# Patient Record
Sex: Female | Born: 1990 | Race: Black or African American | Hispanic: No | Marital: Married | Smoking: Never smoker
Health system: Southern US, Community
[De-identification: ages and names within clinical notes are randomized; demographics above are authoritative.]

## PROBLEM LIST (undated history)

## (undated) DIAGNOSIS — N809 Endometriosis, unspecified: Secondary | ICD-10-CM

## (undated) DIAGNOSIS — R51 Headache: Secondary | ICD-10-CM

## (undated) DIAGNOSIS — Z803 Family history of malignant neoplasm of breast: Secondary | ICD-10-CM

## (undated) DIAGNOSIS — R519 Headache, unspecified: Secondary | ICD-10-CM

## (undated) DIAGNOSIS — G510 Bell's palsy: Secondary | ICD-10-CM

## (undated) DIAGNOSIS — T753XXA Motion sickness, initial encounter: Secondary | ICD-10-CM

## (undated) DIAGNOSIS — G43909 Migraine, unspecified, not intractable, without status migrainosus: Secondary | ICD-10-CM

## (undated) DIAGNOSIS — Z3483 Encounter for supervision of other normal pregnancy, third trimester: Secondary | ICD-10-CM

## (undated) DIAGNOSIS — O26893 Other specified pregnancy related conditions, third trimester: Secondary | ICD-10-CM

## (undated) DIAGNOSIS — O169 Unspecified maternal hypertension, unspecified trimester: Secondary | ICD-10-CM

## (undated) HISTORY — DX: Migraine, unspecified, not intractable, without status migrainosus: G43.909

## (undated) HISTORY — DX: Family history of malignant neoplasm of breast: Z80.3

## (undated) HISTORY — PX: WISDOM TOOTH EXTRACTION: SHX21

---
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
## 2012-11-18 ENCOUNTER — Ambulatory Visit: Payer: Self-pay | Admitting: Family Medicine

## 2012-12-02 ENCOUNTER — Encounter: Payer: Self-pay | Admitting: Family Medicine

## 2012-12-13 ENCOUNTER — Encounter: Payer: Self-pay | Admitting: Family Medicine

## 2013-01-12 ENCOUNTER — Encounter: Payer: Self-pay | Admitting: Family Medicine

## 2013-02-03 ENCOUNTER — Emergency Department: Payer: Self-pay | Admitting: Emergency Medicine

## 2013-06-16 ENCOUNTER — Ambulatory Visit (INDEPENDENT_AMBULATORY_CARE_PROVIDER_SITE_OTHER): Payer: Federal, State, Local not specified - PPO | Admitting: General Surgery

## 2013-06-16 ENCOUNTER — Encounter: Payer: Self-pay | Admitting: General Surgery

## 2013-06-16 VITALS — BP 130/80 | Ht 65.0 in | Wt 187.0 lb

## 2013-06-16 DIAGNOSIS — R109 Unspecified abdominal pain: Secondary | ICD-10-CM

## 2013-06-16 DIAGNOSIS — R110 Nausea: Secondary | ICD-10-CM

## 2013-06-16 DIAGNOSIS — Z8669 Personal history of other diseases of the nervous system and sense organs: Secondary | ICD-10-CM

## 2013-06-16 DIAGNOSIS — Z3200 Encounter for pregnancy test, result unknown: Secondary | ICD-10-CM
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
## 2013-06-16 NOTE — Patient Instructions (Addendum)
The patient is aware to call back for any questions or concerns.  This patient is to have labs drawn at [facility] lab today.  Patient has been scheduled for an abdominal ultrasound at [facility] for 06-29-13 at 8 am (arrive 7:45 am). If abdominal ultrasound is negative, patient will have a HIDA scan at 8:30 am the same day. Patient will need to have a beta HCG serum pregnancy test within one week of test. She has lab form and aware to have this drawn at [place] lab the week prior.

## 2013-06-17 ENCOUNTER — Telehealth: Payer: Self-pay | Admitting: *Deleted

## 2013-06-17 LAB — COMPREHENSIVE METABOLIC PANEL
AST: 18 IU/L (ref 0–40)
Albumin/Globulin Ratio: 1.2 (ref 1.1–2.5)
Albumin: 4.3 g/dL (ref 3.5–5.5)
Alkaline Phosphatase: 107 IU/L (ref 39–117)
BUN/Creatinine Ratio: 17 (ref 8–20)
BUN: 12 mg/dL (ref 6–20)
Chloride: 101 mmol/L (ref 97–108)
Creatinine, Ser: 0.72 mg/dL (ref 0.57–1.00)
GFR calc Af Amer: 139 mL/min/{1.73_m2} (ref >59)
GFR calc non Af Amer: 121 mL/min/{1.73_m2} (ref >59)
Globulin, Total: 3.6 g/dL (ref 1.5–4.5)
Glucose: 86 mg/dL (ref 65–99)
Total Bilirubin: 0.3 mg/dL (ref 0.0–1.2)
Total Protein: 7.9 g/dL (ref 6.0–8.5)

## 2013-06-17 LAB — CBC WITH DIFFERENTIAL
Basos: 1 %
Eos: 1 %
Eosinophils Absolute: 0 10*3/uL (ref 0.0–0.4)
HCT: 39.6 % (ref 34.0–46.6)
Hemoglobin: 13.3 g/dL (ref 11.1–15.9)
Immature Grans (Abs): 0 10*3/uL (ref 0.0–0.1)
Immature Granulocytes: 0 %
Lymphs: 44 %
MCHC: 33.6 g/dL (ref 31.5–35.7)
Monocytes Absolute: 0.3 10*3/uL (ref 0.1–0.9)
Monocytes: 6 %
Neutrophils Absolute: 2 10*3/uL (ref 1.4–7.0)
Neutrophils Relative %: 48 %
RBC: 4.54 x10E6/uL (ref 3.77–5.28)

## 2013-06-17 NOTE — Telephone Encounter (Addendum)
Pt called and was seen in the office yesterday 06/16/13 for gallbladder problems and today she called stated that she was in bad pain and was very nausea. She wanted to know what she needs to do about this.  Patient is going for an abdominal ultrasound at [facility] for 06-29-13. She is going to call them and see if she can change her appointment and get one sooner.

## 2013-06-18 ENCOUNTER — Encounter: Payer: Self-pay | Admitting: General Surgery

## 2013-06-18 ENCOUNTER — Ambulatory Visit: Payer: Self-pay | Admitting: General Surgery

## 2013-06-18 ENCOUNTER — Telehealth: Payer: Self-pay | Admitting: *Deleted

## 2013-06-18 DIAGNOSIS — Z8669 Personal history of other diseases of the nervous system and sense organs: Secondary | ICD-10-CM | POA: Insufficient documentation

## 2013-06-18 DIAGNOSIS — R109 Unspecified abdominal pain: Secondary | ICD-10-CM | POA: Insufficient documentation

## 2013-06-18 LAB — HCG, SERUM, QUALITATIVE: hCG, Serum: <1

## 2013-06-18 NOTE — Telephone Encounter (Signed)
Notified patient as instructed, patient pleased °

## 2013-06-18 NOTE — Telephone Encounter (Signed)
Message copied by Levada Schilling on Fri Jun 18, 2013 12:27 PM ------      Message from: [name]      Created: Fri Jun 18, 2013 12:03 PM       Please notify the patient her gallbladder tests were all fine. Dr. Carlynn Purl will be contacting her regarding a change in her stomach. ------

## 2013-06-20 ENCOUNTER — Telehealth: Payer: Self-pay | Admitting: General Surgery

## 2013-06-20 NOTE — Telephone Encounter (Signed)
I contacted the patient to review with her the results of her recently completed abdominal ultrasound and HIDA scan with CCK. She reports she was uncomfortable when she went for ultrasound and this was not worsened during that exam. She noticed no exacerbation of her pain ring the HIDA scan prior to or after the CCK injection. Her ejection fraction was normal at 71%.  This is her third negative abdominal gallbladder ultrasound and first a HIDA scan with CCK colon all of which show no evidence of biliary tract disease.  Her CBC and comprehensive metabolic panel were all normal.  The patient reports that Kate Sable, M.D. from the GYN service has discussed with her the possibility of diagnostic laparoscopy (I suspect for some lower abdominal symptoms). If this is scheduled, I've encouraged the patient to have Dr. Luella Cook notify me of the date as it would be very appropriate to take a look at the upper abdomen at the same time.  I spoke with Dr. Carlynn Purl, the patient's primary physician on December 5. In their records she was making use of Zantac, this may be changed to a PPI. The patient was told that she should be hearing from her PCP on December 8.

## 2013-06-21 ENCOUNTER — Encounter: Payer: Self-pay | Admitting: General Surgery

## 2013-07-20 ENCOUNTER — Telehealth: Payer: Self-pay | Admitting: *Deleted

## 2013-07-20 NOTE — Telephone Encounter (Signed)
Patient to have a diagnostic laparoscopy completed by [facility]Jackson [facility] on 08-10-13. Dr. Lemar LivingsByrnett is aware and will be available to look in that day if needed.  He will not be completing a separate procedure.  Per Tacey RuizLeah in the O.R. this case is tentatively posted for 9 am at present.

## 2013-08-02 ENCOUNTER — Ambulatory Visit: Payer: Self-pay | Admitting: Gastroenterology

## 2013-08-09 ENCOUNTER — Ambulatory Visit: Payer: Self-pay | Admitting: Obstetrics and Gynecology

## 2013-08-09 LAB — COMPREHENSIVE METABOLIC PANEL
ALBUMIN: 3.8 g/dL (ref 3.4–5.0)
ALK PHOS: 107 U/L
ALT: 29 U/L (ref 12–78)
ANION GAP: 6 — AB (ref 7–16)
BUN: 17 mg/dL (ref 7–18)
Bilirubin,Total: 0.2 mg/dL (ref 0.2–1.0)
CHLORIDE: 105 mmol/L (ref 98–107)
Calcium, Total: 9.2 mg/dL (ref 8.5–10.1)
Co2: 25 mmol/L (ref 21–32)
Creatinine: 0.73 mg/dL (ref 0.60–1.30)
EGFR (Non-African Amer.): >60
Glucose: 88 mg/dL (ref 65–99)
Osmolality: 273 (ref 275–301)
POTASSIUM: 3.9 mmol/L (ref 3.5–5.1)
SGOT(AST): 23 U/L (ref 15–37)
SODIUM: 136 mmol/L (ref 136–145)
TOTAL PROTEIN: 8.5 g/dL — AB (ref 6.4–8.2)

## 2013-08-09 LAB — PREGNANCY, URINE: Pregnancy Test, Urine: NEGATIVE m[IU]/mL

## 2013-08-09 LAB — CBC
HCT: 42 % (ref 35.0–47.0)
HGB: 13.7 g/dL (ref 12.0–16.0)
MCH: 28.4 pg (ref 26.0–34.0)
MCHC: 32.6 g/dL (ref 32.0–36.0)
MCV: 87 fL (ref 80–100)
Platelet: 186 10*3/uL (ref 150–440)
RBC: 4.83 10*6/uL (ref 3.80–5.20)
RDW: 13.8 % (ref 11.5–14.5)
WBC: 5.1 10*3/uL (ref 3.6–11.0)

## 2013-08-12 ENCOUNTER — Ambulatory Visit: Payer: Self-pay | Admitting: Obstetrics and Gynecology

## 2013-08-12 HISTORY — PX: LAPAROSCOPY: SHX197

## 2013-08-14 LAB — PATHOLOGY REPORT

## 2013-08-15 ENCOUNTER — Emergency Department: Payer: Self-pay | Admitting: Emergency Medicine

## 2013-08-15 LAB — CBC WITH DIFFERENTIAL/PLATELET
BASOS ABS: 0 10*3/uL (ref 0.0–0.1)
Basophil %: 0.8 %
EOS PCT: 1.8 %
Eosinophil #: 0.1 10*3/uL (ref 0.0–0.7)
HCT: 39.9 % (ref 35.0–47.0)
HGB: 13.3 g/dL (ref 12.0–16.0)
Lymphocyte #: 2.2 10*3/uL (ref 1.0–3.6)
Lymphocyte %: 45.2 %
MCH: 28.7 pg (ref 26.0–34.0)
MCHC: 33.4 g/dL (ref 32.0–36.0)
MCV: 86 fL (ref 80–100)
MONO ABS: 0.4 x10 3/mm (ref 0.2–0.9)
Monocyte %: 8.8 %
NEUTROS ABS: 2.1 10*3/uL (ref 1.4–6.5)
Neutrophil %: 43.4 %
Platelet: 186 10*3/uL (ref 150–440)
RBC: 4.64 10*6/uL (ref 3.80–5.20)
RDW: 13.8 % (ref 11.5–14.5)
WBC: 4.8 10*3/uL (ref 3.6–11.0)

## 2013-08-15 LAB — COMPREHENSIVE METABOLIC PANEL
ALBUMIN: 3.5 g/dL (ref 3.4–5.0)
AST: 30 U/L (ref 15–37)
Alkaline Phosphatase: 127 U/L — ABNORMAL HIGH
Anion Gap: 5 — ABNORMAL LOW (ref 7–16)
BUN: 10 mg/dL (ref 7–18)
Bilirubin,Total: 0.2 mg/dL (ref 0.2–1.0)
CO2: 25 mmol/L (ref 21–32)
Calcium, Total: 9.3 mg/dL (ref 8.5–10.1)
Chloride: 107 mmol/L (ref 98–107)
Creatinine: 0.72 mg/dL (ref 0.60–1.30)
EGFR (Non-African Amer.): >60
Glucose: 95 mg/dL (ref 65–99)
OSMOLALITY: 273 (ref 275–301)
Potassium: 4.2 mmol/L (ref 3.5–5.1)
SGPT (ALT): 37 U/L (ref 12–78)
SODIUM: 137 mmol/L (ref 136–145)
TOTAL PROTEIN: 8 g/dL (ref 6.4–8.2)

## 2013-08-15 LAB — URINALYSIS, COMPLETE
BILIRUBIN, UR: NEGATIVE
Bacteria: NONE SEEN
Glucose,UR: NEGATIVE mg/dL (ref 0–75)
Ketone: NEGATIVE
Nitrite: NEGATIVE
PH: 6 (ref 4.5–8.0)
PROTEIN: NEGATIVE
Specific Gravity: 1.02 (ref 1.003–1.030)
Squamous Epithelial: 2

## 2013-08-15 LAB — LIPASE, BLOOD: LIPASE: 77 U/L (ref 73–393)

## 2013-09-19 ENCOUNTER — Emergency Department: Payer: Self-pay | Admitting: Emergency Medicine

## 2013-09-19 LAB — CBC WITH DIFFERENTIAL/PLATELET
BASOS ABS: 0 10*3/uL (ref 0.0–0.1)
BASOS PCT: 0.2 %
Eosinophil #: 0.1 10*3/uL (ref 0.0–0.7)
Eosinophil %: 1.4 %
HCT: 41.8 % (ref 35.0–47.0)
HGB: 14.2 g/dL (ref 12.0–16.0)
Lymphocyte #: 1.4 10*3/uL (ref 1.0–3.6)
Lymphocyte %: 26.9 %
MCH: 29.1 pg (ref 26.0–34.0)
MCHC: 34.1 g/dL (ref 32.0–36.0)
MCV: 86 fL (ref 80–100)
MONOS PCT: 6.5 %
Monocyte #: 0.3 x10 3/mm (ref 0.2–0.9)
NEUTROS PCT: 65 %
Neutrophil #: 3.4 10*3/uL (ref 1.4–6.5)
Platelet: 194 10*3/uL (ref 150–440)
RBC: 4.89 10*6/uL (ref 3.80–5.20)
RDW: 13.6 % (ref 11.5–14.5)
WBC: 5.2 10*3/uL (ref 3.6–11.0)

## 2013-09-19 LAB — COMPREHENSIVE METABOLIC PANEL
ALBUMIN: 3.8 g/dL (ref 3.4–5.0)
ALT: 26 U/L (ref 12–78)
AST: 16 U/L (ref 15–37)
Alkaline Phosphatase: 137 U/L — ABNORMAL HIGH
Anion Gap: 6 — ABNORMAL LOW (ref 7–16)
BILIRUBIN TOTAL: 0.7 mg/dL (ref 0.2–1.0)
BUN: 11 mg/dL (ref 7–18)
CHLORIDE: 107 mmol/L (ref 98–107)
CREATININE: 0.73 mg/dL (ref 0.60–1.30)
Calcium, Total: 9.2 mg/dL (ref 8.5–10.1)
Co2: 23 mmol/L (ref 21–32)
EGFR (African American): >60
GLUCOSE: 96 mg/dL (ref 65–99)
OSMOLALITY: 271 (ref 275–301)
POTASSIUM: 3.8 mmol/L (ref 3.5–5.1)
SODIUM: 136 mmol/L (ref 136–145)
Total Protein: 8.9 g/dL — ABNORMAL HIGH (ref 6.4–8.2)

## 2013-09-19 LAB — URINALYSIS, COMPLETE
BILIRUBIN, UR: NEGATIVE
Blood: NEGATIVE
Glucose,UR: NEGATIVE mg/dL (ref 0–75)
Ketone: NEGATIVE
Leukocyte Esterase: NEGATIVE
NITRITE: NEGATIVE
PH: 5 (ref 4.5–8.0)
Protein: NEGATIVE
RBC,UR: 1 /HPF (ref 0–5)
SPECIFIC GRAVITY: 1.029 (ref 1.003–1.030)
WBC UR: 2 /HPF (ref 0–5)

## 2013-09-19 LAB — LIPASE, BLOOD: LIPASE: 94 U/L (ref 73–393)

## 2014-01-18 ENCOUNTER — Emergency Department: Payer: Self-pay | Admitting: Emergency Medicine

## 2014-01-20 ENCOUNTER — Emergency Department: Payer: Self-pay | Admitting: Emergency Medicine

## 2014-01-21 ENCOUNTER — Emergency Department: Payer: Self-pay | Admitting: Emergency Medicine

## 2014-05-16 ENCOUNTER — Encounter: Payer: Self-pay | Admitting: General Surgery

## 2014-05-23 ENCOUNTER — Emergency Department: Payer: Self-pay | Admitting: Emergency Medicine

## 2014-05-23 LAB — CBC WITH DIFFERENTIAL/PLATELET
Basophil #: 0 10*3/uL (ref 0.0–0.1)
Basophil %: 0.6 %
EOS ABS: 0.1 10*3/uL (ref 0.0–0.7)
EOS PCT: 1 %
HCT: 39.8 % (ref 35.0–47.0)
HGB: 12.9 g/dL (ref 12.0–16.0)
Lymphocyte #: 1.7 10*3/uL (ref 1.0–3.6)
Lymphocyte %: 29.1 %
MCH: 27.8 pg (ref 26.0–34.0)
MCHC: 32.4 g/dL (ref 32.0–36.0)
MCV: 86 fL (ref 80–100)
Monocyte #: 0.4 x10 3/mm (ref 0.2–0.9)
Monocyte %: 6.9 %
NEUTROS ABS: 3.6 10*3/uL (ref 1.4–6.5)
Neutrophil %: 62.4 %
PLATELETS: 183 10*3/uL (ref 150–440)
RBC: 4.63 10*6/uL (ref 3.80–5.20)
RDW: 14 % (ref 11.5–14.5)
WBC: 5.8 10*3/uL (ref 3.6–11.0)

## 2014-05-23 LAB — COMPREHENSIVE METABOLIC PANEL
ALBUMIN: 3.4 g/dL (ref 3.4–5.0)
Alkaline Phosphatase: 156 U/L — ABNORMAL HIGH
Anion Gap: 9 (ref 7–16)
BILIRUBIN TOTAL: 0.3 mg/dL (ref 0.2–1.0)
BUN: 10 mg/dL (ref 7–18)
Calcium, Total: 8.4 mg/dL — ABNORMAL LOW (ref 8.5–10.1)
Chloride: 108 mmol/L — ABNORMAL HIGH (ref 98–107)
Co2: 23 mmol/L (ref 21–32)
Creatinine: 0.77 mg/dL (ref 0.60–1.30)
EGFR (African American): >60
EGFR (Non-African Amer.): >60
Glucose: 98 mg/dL (ref 65–99)
Osmolality: 278 (ref 275–301)
POTASSIUM: 4.2 mmol/L (ref 3.5–5.1)
SGOT(AST): 34 U/L (ref 15–37)
SGPT (ALT): 35 U/L
Sodium: 140 mmol/L (ref 136–145)
TOTAL PROTEIN: 7.9 g/dL (ref 6.4–8.2)

## 2014-05-23 LAB — URINALYSIS, COMPLETE
Bacteria: NONE SEEN
Bilirubin,UR: NEGATIVE
Glucose,UR: NEGATIVE mg/dL (ref 0–75)
Ketone: NEGATIVE
NITRITE: NEGATIVE
PH: 5 (ref 4.5–8.0)
Protein: 30
RBC,UR: 36 /HPF (ref 0–5)
Specific Gravity: 1.014 (ref 1.003–1.030)
Squamous Epithelial: 1

## 2014-05-23 LAB — LIPASE, BLOOD: Lipase: 75 U/L (ref 73–393)

## 2014-05-23 LAB — WET PREP, GENITAL

## 2014-08-10 ENCOUNTER — Emergency Department: Payer: Self-pay | Admitting: Emergency Medicine

## 2014-10-04 ENCOUNTER — Emergency Department: Payer: Self-pay | Admitting: Internal Medicine

## 2014-10-30 ENCOUNTER — Emergency Department
Admit: 2014-10-30 | Disposition: A | Discharge status: Home / Self Care | Payer: Self-pay | Admitting: Emergency Medicine

## 2014-10-30 LAB — URINALYSIS, COMPLETE
Bacteria: NONE SEEN
Bilirubin,UR: NEGATIVE
Glucose,UR: NEGATIVE mg/dL (ref 0–75)
Ketone: NEGATIVE
Nitrite: NEGATIVE
PH: 7 (ref 4.5–8.0)
SPECIFIC GRAVITY: 1.013 (ref 1.003–1.030)

## 2014-10-30 LAB — COMPREHENSIVE METABOLIC PANEL
ALK PHOS: 140 U/L — AB
Albumin: 3.9 g/dL
Anion Gap: 8 (ref 7–16)
BILIRUBIN TOTAL: 0.2 mg/dL — AB
BUN: 8 mg/dL
CHLORIDE: 106 mmol/L
CREATININE: 0.72 mg/dL
Calcium, Total: 9 mg/dL
Co2: 23 mmol/L
EGFR (African American): >60
EGFR (Non-African Amer.): >60
Glucose: 90 mg/dL
Potassium: 3.8 mmol/L
SGOT(AST): 26 U/L
SGPT (ALT): 19 U/L
Sodium: 137 mmol/L
TOTAL PROTEIN: 8.5 g/dL — AB

## 2014-10-30 LAB — CBC
HCT: 39.3 % (ref 35.0–47.0)
HGB: 12.8 g/dL (ref 12.0–16.0)
MCH: 27.1 pg (ref 26.0–34.0)
MCHC: 32.5 g/dL (ref 32.0–36.0)
MCV: 84 fL (ref 80–100)
PLATELETS: 192 10*3/uL (ref 150–440)
RBC: 4.71 10*6/uL (ref 3.80–5.20)
RDW: 14.7 % — ABNORMAL HIGH (ref 11.5–14.5)
WBC: 8.3 10*3/uL (ref 3.6–11.0)

## 2014-10-30 LAB — HCG, QUANTITATIVE, PREGNANCY: Beta Hcg, Quant.: <1 m[IU]/mL

## 2014-10-30 LAB — PREGNANCY, URINE: Pregnancy Test, Urine: NEGATIVE m[IU]/mL

## 2014-11-02 LAB — URINE CULTURE

## 2014-11-05 NOTE — Op Note (Signed)
PATIENT NAME:  Krista Drake, Krista Drake DATE OF BIRTH:  [birth year]  DATE OF PROCEDURE:  08/12/2013  PREOPERATIVE DIAGNOSIS: Chronic pelvic pain.   POSTOPERATIVE DIAGNOSES: 1.  Chronic pelvic pain. 2.  Evidence of Fitz-Hugh Curtis syndrome.   PROCEDURES: 1.  Diagnostic laparoscopy.  2.  Lysis of adhesions.   ANESTHESIA:  General.  SURGEON: Thomasene MohairStephen Citlali Gautney, M.D.   ASSISTANT: Harmon DunMichelle Johnson, PA-S   ESTIMATED BLOOD LOSS: 10 mL  OPERATIVE FLUIDS:  600 mL  COMPLICATIONS: None.   FINDINGS: 1.  Filmy adhesions between the liver and the anterior abdominal wall.  2.  Adhesions of the cecum to the right abdominal anterior wall.  3.  Small vesicles in the left ovarian fossa.   SPECIMEN: Left ovarian fossa peritoneum biopsy.  CONDITION AT THE END OF PROCEDURE:  Stable.   PROCEDURE IN DETAIL: The patient was taken to the operating room where general anesthesia was administered and found to be adequate. She was placed in the dorsal supine lithotomy position in the [place] stirrups and prepped and draped in the usual sterile fashion. After timeout was called, an indwelling catheter was placed and a sponge stick was placed in the vagina for uterine manipulation.   Attention was turned to the abdomen where after injection of local anesthesia, a 5 mm infraumbilical incision was made with a scalpel. Entrance was gained into the abdomen using camera visualization through an Optiview trocar method with direct visualization. Once the abdomen was entered, entrance into the abdomen was verified using opening pressure, which was found to be normal. After insufflation of the abdomen with CO2, the camera was reintroduced through the entrance of the umbilical port and atraumatic entry was verified. Abdominopelvic survey was undertaken with the above-noted findings. Under direct intra-abdominal camera visualization, a left lower quadrant port was made with care to avoid any obvious superficial vessels  and the inferior epigastric vessel. This was made without difficulty and the trocar was placed. The liver was visualized with filmy adhesions noted to be between the liver and the anterior abdominal wall. These were taken down without difficulty using the Harmonic scalpel. There were adhesions of the cecum to the right anterior abdominal wall which were gently taken down using mostly blunt dissection. The appendix was visualized and appeared to be normal. After visualization and survey was taken of the pelvis, a biopsy was taken of the left ovarian fossa with care to pull the peritoneum away from the left pelvic sidewall such that any underlying structures would be safe and undamaged. After this portion of the procedure, the procedure was terminated due to lack of any significant findings. The abdomen was desufflated of CO2. Each trocar was removed.   Each skin site was reapproximated using a 3-0 Vicryl in a subcutaneous fashion. The skin was reapproximated using Dermabond. Additional 10 mL of local anesthetic was injected between both incision sites.   The indwelling catheter was then removed and the sponge stick was removed from the vagina at the end of the case.   The patient tolerated the procedure well. Sponge, lap and needle counts were correct x 2. For VTE prophylaxis, the patient was wearing pneumatic compression stockings which were on and operating throughout the entire procedure. She was awakened in the operating room and taken to the recovery area in stable condition.   ____________________________ Conard NovakStephen D. Mang Hazelrigg, MD sdj:ce D: 08/20/2013 16:41:00 ET T: 08/20/2013 19:59:35 ET JOB#: [id number]  cc: Conard NovakStephen D. Yassmine Tamm, MD, <Dictator> Conard NovakSTEPHEN D Keshawn Sundberg MD ELECTRONICALLY SIGNED  08/24/2013 3:13 

## 2014-11-14 ENCOUNTER — Encounter: Payer: Self-pay | Admitting: *Deleted

## 2014-11-14 DIAGNOSIS — J039 Acute tonsillitis, unspecified: Secondary | ICD-10-CM | POA: Diagnosis not present

## 2014-11-14 DIAGNOSIS — Z792 Long term (current) use of antibiotics: Secondary | ICD-10-CM | POA: Diagnosis not present

## 2014-11-14 DIAGNOSIS — J029 Acute pharyngitis, unspecified: Secondary | ICD-10-CM | POA: Diagnosis present

## 2014-11-14 NOTE — ED Notes (Signed)
Pt woke this am with sore throat. Pt states pain worsened through the day

## 2014-11-15 ENCOUNTER — Emergency Department
Admission: EM | Admit: 2014-11-15 | Discharge: 2014-11-15 | Disposition: A | Discharge status: Home / Self Care | Payer: Federal, State, Local not specified - PPO | Attending: Emergency Medicine | Admitting: Emergency Medicine

## 2014-11-15 ENCOUNTER — Encounter: Payer: Self-pay | Admitting: Emergency Medicine

## 2014-11-15 DIAGNOSIS — J039 Acute tonsillitis, unspecified: Secondary | ICD-10-CM

## 2014-11-15 HISTORY — DX: Headache: R51

## 2014-11-15 HISTORY — DX: Headache, unspecified: R51.9

## 2014-11-15 MED ORDER — AMOXICILLIN 500 MG PO CAPS: 500.0000 mg | ORAL_CAPSULE | Freq: Once | ORAL | Status: AC

## 2014-11-15 MED ORDER — MAGIC MOUTHWASH
10.0000 mL | Freq: Once | ORAL | Status: AC
  Administered 2014-11-15: 10 mL via ORAL
  Filled 2014-11-15: qty 10

## 2014-11-15 MED ORDER — DEXAMETHASONE SODIUM PHOSPHATE 10 MG/ML IJ SOLN
10.0000 mg | Freq: Once | INTRAMUSCULAR | Status: AC
  Administered 2014-11-15: 10 mg via INTRAMUSCULAR

## 2014-11-15 MED ORDER — DEXAMETHASONE SODIUM PHOSPHATE 10 MG/ML IJ SOLN
INTRAMUSCULAR | Status: AC
  Filled 2014-11-15: qty 1

## 2014-11-15 MED ORDER — AMOXICILLIN 500 MG PO CAPS: 500.0000 mg | ORAL_CAPSULE | Freq: Three times a day (TID) | ORAL | Status: DC

## 2014-11-15 MED ORDER — MAGIC MOUTHWASH: 5.0000 mL | Freq: Three times a day (TID) | ORAL | Status: DC | PRN

## 2014-11-15 MED ORDER — AMOXICILLIN 500 MG PO CAPS
ORAL_CAPSULE | ORAL | Status: AC
  Filled 2014-11-15: qty 1

## 2014-11-15 NOTE — ED Provider Notes (Signed)
[facility] Emergency Department Provider Note    ____________________________________________  Time seen: 0330  I have reviewed the triage vital signs and the nursing notes.   HISTORY  Chief Complaint Sore Throat       HPI Krista Drake is a 24 y.o. female who presents with a one-day history of sore throat. Patient works at a daycare - +sick contacts.Patient endorses subjective fever, earache, cough. Patient complains of 10/10 pain on swallowing. Patient denies respiratory distress, drooling, vomiting, chest pain, shortness of breath.     Past Medical History  Diagnosis Date  . Migraine   . Headache     Patient Active Problem List   Diagnosis Date Noted  . Abdominal pain, unspecified site 06/18/2013  . Hx of migraine headaches 06/18/2013    Past Surgical History  Procedure Laterality Date  . Wisdom tooth extraction      Current Outpatient Rx  Name  Route  Sig  Dispense  Refill  . Alum & Mag Hydroxide-Simeth (MAGIC MOUTHWASH) SOLN   Oral   Take 5 mLs by mouth 3 (three) times daily as needed for mouth pain.   30 mL   0   . amoxicillin (AMOXIL) 500 MG capsule   Oral   Take 1 capsule (500 mg total) by mouth 3 (three) times daily.   21 capsule   0     Allergies Zoloft  Family History  Problem Relation Age of Onset  . Diabetes Maternal Grandmother   . Diabetes Maternal Grandfather   . Cancer Paternal Grandmother     breast  . Anemia Mother     Social History History  Substance Use Topics  . Smoking status: Never Smoker   . Smokeless tobacco: Never Used  . Alcohol Use: No    Review of Systems  Constitutional: Positive for subjective fever and chills. Eyes: Negative for visual changes. ENT: Positive for sore throat and earache. Cardiovascular: Negative for chest pain. Respiratory: Negative for shortness of breath. Positive for nonproductive cough. Gastrointestinal: Negative for abdominal pain, vomiting and  diarrhea. Genitourinary: Negative for dysuria. Musculoskeletal: Negative for back pain. Skin: Negative for rash. Neurological: Negative for headaches, focal weakness or numbness.   10-point ROS otherwise negative.  ____________________________________________   PHYSICAL EXAM:  VITAL SIGNS: ED Triage Vitals  Enc Vitals Group     BP 11/14/14 2235 127/86 mmHg     Pulse Rate 11/14/14 2235 117     Resp 11/14/14 2235 18     Temp 11/14/14 2235 99.5 F (37.5 C)     Temp Source 11/14/14 2235 Oral     SpO2 11/14/14 2235 99 %     Weight 11/14/14 2235 215 lb (97.523 kg)     Height 11/14/14 2235  (1.651 m)     Head Cir --      Peak Flow --      Pain Score 11/14/14 2236 9     Pain Loc --      Pain Edu? --      Excl. in GC? --      Constitutional: Alert and oriented. Well appearing and in no distress. Eyes: Conjunctivae are normal. PERRL. Normal extraocular movements. ENT   Head: Normocephalic and atraumatic.   Nose: No congestion/rhinnorhea.   Mouth/Throat: Mucous membranes are moist. Pharyngeal erythema, tonsillar exudate with swelling. No peritonsillar mass. Hoarse voice. No increased secretion of saliva. No muffled voice.   Neck: No stridor. Hematological/Lymphatic/Immunilogical: No cervical lymphadenopathy. Cardiovascular: Normal rate, regular rhythm. Normal  and symmetric distal pulses are present in all extremities. No murmurs, rubs, or gallops. Respiratory: Normal respiratory effort without tachypnea nor retractions. Breath sounds are clear and equal bilaterally. No wheezes/rales/rhonchi. Gastrointestinal: Soft and nontender. No distention. No abdominal bruits. There is no CVA tenderness. Genitourinary: Deferred Musculoskeletal: Nontender with normal range of motion in all extremities. No joint effusions.  No lower extremity tenderness nor edema. Neurologic:  Normal speech and language. No gross focal neurologic deficits are appreciated. Speech is normal. No  gait instability. Skin:  Skin is warm, dry and intact. No rash noted. Psychiatric: Mood and affect are normal. Speech and behavior are normal. Patient exhibits appropriate insight and judgment.  ____________________________________________    LABS (pertinent positives/negatives)  None  ____________________________________________   EKG  None  ____________________________________________    RADIOLOGY  None  ____________________________________________   PROCEDURES  Procedure(s) performed: None  Critical Care performed: No  ____________________________________________   INITIAL IMPRESSION / ASSESSMENT AND PLAN / ED COURSE  Pertinent labs & imaging results that were available during my care of the patient were reviewed by me and considered in my medical decision making (see chart for details).  24 year old female with tonsillitis. Plan to treat with amoxicillin, IM single dose Decadron, Duke's Magic mouthwash, follow up PCP. Discussed with patient and given strict return precautions. Patient verbalizes understanding and agrees with plan.  ____________________________________________   FINAL CLINICAL IMPRESSION(S) / ED DIAGNOSES  Final diagnoses:  Tonsillitis with exudate     Irean HongJade J Sung, MD 11/15/14 [phone]

## 2014-11-15 NOTE — Discharge Instructions (Signed)
1. Take antibiotic as prescribed (amoxicillin 500 mg 3 times daily 7 days). 2. Use Duke's Magic mouthwash as needed for throat pain. 3. Return to the ER for worsening symptoms, persistent vomiting, difficulty breathing or other concerns.  Tonsillitis Tonsillitis is an infection of the throat that causes the tonsils to become red, tender, and swollen. Tonsils are collections of lymphoid tissue at the back of the throat. Each tonsil has crevices (crypts). Tonsils help fight nose and throat infections and keep infection from spreading to other parts of the body for the first 18 months of life.  CAUSES Sudden (acute) tonsillitis is usually caused by infection with streptococcal bacteria. Long-lasting (chronic) tonsillitis occurs when the crypts of the tonsils become filled with pieces of food and bacteria, which makes it easy for the tonsils to become repeatedly infected. SYMPTOMS  Symptoms of tonsillitis include:  A sore throat, with possible difficulty swallowing.  White patches on the tonsils.  Fever.  Tiredness.  New episodes of snoring during sleep, when you did not snore before.  Small, foul-smelling, yellowish-white pieces of material (tonsilloliths) that you occasionally cough up or spit out. The tonsilloliths can also cause you to have bad breath. DIAGNOSIS Tonsillitis can be diagnosed through a physical exam. Diagnosis can be confirmed with the results of lab tests, including a throat culture. TREATMENT  The goals of tonsillitis treatment include the reduction of the severity and duration of symptoms and prevention of associated conditions. Symptoms of tonsillitis can be improved with the use of steroids to reduce the swelling. Tonsillitis caused by bacteria can be treated with antibiotic medicines. Usually, treatment with antibiotic medicines is started before the cause of the tonsillitis is known. However, if it is determined that the cause is not bacterial, antibiotic medicines  will not treat the tonsillitis. If attacks of tonsillitis are severe and frequent, your health care provider may recommend surgery to remove the tonsils (tonsillectomy). HOME CARE INSTRUCTIONS   Rest as much as possible and get plenty of sleep.  Drink plenty of fluids. While the throat is very sore, eat soft foods or liquids, such as sherbet, soups, or instant breakfast drinks.  Eat frozen ice pops.  Gargle with a warm or cold liquid to help soothe the throat. Mix 1/4 teaspoon of salt and 1/4 teaspoon of baking soda in 8 oz of water. SEEK MEDICAL CARE IF:   Large, tender lumps develop in your neck.  A rash develops.  A green, yellow-brown, or bloody substance is coughed up.  You are unable to swallow liquids or food for 24 hours.  You notice that only one of the tonsils is swollen. SEEK IMMEDIATE MEDICAL CARE IF:   You develop any new symptoms such as vomiting, severe headache, stiff neck, chest pain, or trouble breathing or swallowing.  You have severe throat pain along with drooling or voice changes.  You have severe pain, unrelieved with recommended medications.  You are unable to fully open the mouth.  You develop redness, swelling, or severe pain anywhere in the neck.  You have a fever. MAKE SURE YOU:   Understand these instructions.  Will watch your condition.  Will get help right away if you are not doing well or get worse. Document Released: 04/10/2005 Document Revised: 11/15/2013 Document Reviewed: 12/18/2012 [facility] Patient Information 2015 [employer] [place]. This information is not intended to replace advice given to you by your health care provider. Make sure you discuss any questions you have with your health care provider.

## 2015-01-10 ENCOUNTER — Encounter: Payer: Self-pay | Admitting: Emergency Medicine

## 2015-01-10 ENCOUNTER — Emergency Department
Admission: EM | Admit: 2015-01-10 | Discharge: 2015-01-10 | Disposition: A | Discharge status: Home / Self Care | Payer: Federal, State, Local not specified - PPO | Attending: Emergency Medicine | Admitting: Emergency Medicine

## 2015-01-10 DIAGNOSIS — N390 Urinary tract infection, site not specified: Secondary | ICD-10-CM | POA: Insufficient documentation

## 2015-01-10 DIAGNOSIS — R197 Diarrhea, unspecified: Secondary | ICD-10-CM | POA: Insufficient documentation

## 2015-01-10 DIAGNOSIS — Z3202 Encounter for pregnancy test, result negative: Secondary | ICD-10-CM | POA: Diagnosis not present

## 2015-01-10 DIAGNOSIS — Z79899 Other long term (current) drug therapy: Secondary | ICD-10-CM | POA: Insufficient documentation

## 2015-01-10 DIAGNOSIS — R1030 Lower abdominal pain, unspecified: Secondary | ICD-10-CM | POA: Diagnosis present

## 2015-01-10 DIAGNOSIS — Z792 Long term (current) use of antibiotics: Secondary | ICD-10-CM | POA: Diagnosis not present

## 2015-01-10 DIAGNOSIS — R51 Headache: Secondary | ICD-10-CM | POA: Diagnosis not present

## 2015-01-10 DIAGNOSIS — Z793 Long term (current) use of hormonal contraceptives: Secondary | ICD-10-CM | POA: Diagnosis not present

## 2015-01-10 HISTORY — DX: Endometriosis, unspecified: N80.9

## 2015-01-10 LAB — CBC WITH DIFFERENTIAL/PLATELET
BASOS ABS: 0 10*3/uL (ref 0–0.1)
Basophils Relative: 0 %
Eosinophils Absolute: 0 10*3/uL (ref 0–0.7)
Eosinophils Relative: 0 %
HCT: 44.3 % (ref 35.0–47.0)
Hemoglobin: 14.2 g/dL (ref 12.0–16.0)
Lymphocytes Relative: 14 %
Lymphs Abs: 0.6 10*3/uL — ABNORMAL LOW (ref 1.0–3.6)
MCH: 27.1 pg (ref 26.0–34.0)
MCHC: 32.1 g/dL (ref 32.0–36.0)
MCV: 84.3 fL (ref 80.0–100.0)
Monocytes Absolute: 0.2 10*3/uL (ref 0.2–0.9)
Monocytes Relative: 5 %
NEUTROS PCT: 81 %
Neutro Abs: 3.7 10*3/uL (ref 1.4–6.5)
PLATELETS: 199 10*3/uL (ref 150–440)
RBC: 5.26 MIL/uL — AB (ref 3.80–5.20)
RDW: 15.3 % — AB (ref 11.5–14.5)
WBC: 4.5 10*3/uL (ref 3.6–11.0)

## 2015-01-10 LAB — COMPREHENSIVE METABOLIC PANEL
ALT: 14 U/L (ref 14–54)
ANION GAP: 9 (ref 5–15)
AST: 16 U/L (ref 15–41)
Albumin: 3.9 g/dL (ref 3.5–5.0)
Alkaline Phosphatase: 108 U/L (ref 38–126)
BUN: 8 mg/dL (ref 6–20)
CHLORIDE: 108 mmol/L (ref 101–111)
CO2: 19 mmol/L — ABNORMAL LOW (ref 22–32)
Calcium: 8.9 mg/dL (ref 8.9–10.3)
Creatinine, Ser: 0.72 mg/dL (ref 0.44–1.00)
Glucose, Bld: 90 mg/dL (ref 65–99)
POTASSIUM: 3.8 mmol/L (ref 3.5–5.1)
Sodium: 136 mmol/L (ref 135–145)
TOTAL PROTEIN: 8.2 g/dL — AB (ref 6.5–8.1)
Total Bilirubin: 0.6 mg/dL (ref 0.3–1.2)

## 2015-01-10 LAB — URINALYSIS COMPLETE WITH MICROSCOPIC (ARMC ONLY)
Bilirubin Urine: NEGATIVE
GLUCOSE, UA: NEGATIVE mg/dL
Nitrite: NEGATIVE
Protein, ur: 30 mg/dL — AB
Specific Gravity, Urine: 1.026 (ref 1.005–1.030)
pH: 5 (ref 5.0–8.0)

## 2015-01-10 LAB — POCT PREGNANCY, URINE: PREG TEST UR: NEGATIVE

## 2015-01-10 MED ORDER — KETOROLAC TROMETHAMINE 30 MG/ML IJ SOLN
INTRAMUSCULAR | Status: AC
  Administered 2015-01-10: 30 mg via INTRAVENOUS
  Filled 2015-01-10: qty 1

## 2015-01-10 MED ORDER — CIPROFLOXACIN IN D5W 400 MG/200ML IV SOLN
INTRAVENOUS | Status: AC
  Administered 2015-01-10: 400 mg via INTRAVENOUS
  Filled 2015-01-10: qty 200

## 2015-01-10 MED ORDER — CIPROFLOXACIN HCL 500 MG PO TABS: 500.0000 mg | ORAL_TABLET | Freq: Two times a day (BID) | ORAL | Status: DC

## 2015-01-10 MED ORDER — METOCLOPRAMIDE HCL 5 MG PO TABS: 5.0000 mg | ORAL_TABLET | Freq: Three times a day (TID) | ORAL | Status: DC

## 2015-01-10 MED ORDER — HYDROMORPHONE HCL 1 MG/ML IJ SOLN
0.5000 mg | INTRAMUSCULAR | Status: DC | PRN
  Administered 2015-01-10: 0.5 mg via INTRAVENOUS

## 2015-01-10 MED ORDER — SODIUM CHLORIDE 0.9 % IV BOLUS (SEPSIS)
1000.0000 mL | Freq: Once | INTRAVENOUS | Status: AC
  Administered 2015-01-10: 1000 mL via INTRAVENOUS

## 2015-01-10 MED ORDER — METOCLOPRAMIDE HCL 5 MG/ML IJ SOLN
10.0000 mg | Freq: Once | INTRAMUSCULAR | Status: AC
  Administered 2015-01-10: 10 mg via INTRAVENOUS

## 2015-01-10 MED ORDER — METOCLOPRAMIDE HCL 5 MG/ML IJ SOLN
INTRAMUSCULAR | Status: AC
  Filled 2015-01-10: qty 2

## 2015-01-10 MED ORDER — HYDROMORPHONE HCL 1 MG/ML IJ SOLN
INTRAMUSCULAR | Status: AC
  Filled 2015-01-10: qty 1

## 2015-01-10 MED ORDER — CIPROFLOXACIN IN D5W 400 MG/200ML IV SOLN
400.0000 mg | Freq: Once | INTRAVENOUS | Status: AC
  Administered 2015-01-10: 400 mg via INTRAVENOUS

## 2015-01-10 MED ORDER — KETOROLAC TROMETHAMINE 30 MG/ML IJ SOLN
30.0000 mg | Freq: Once | INTRAMUSCULAR | Status: AC
  Administered 2015-01-10: 30 mg via INTRAVENOUS

## 2015-01-10 NOTE — ED Notes (Signed)
Pt to ed with c/o abd pain since Sunday.  Reports Monday she awoke with headache.  Reports hx of migraines and endometriosis. States now she has vomiting. Pt appears in nad at this time.  No vomiting noted.  Skin warm and dry.

## 2015-01-10 NOTE — ED Provider Notes (Signed)
[facility] Provider Note  ____________________________________________  Time seen: 8:50 AM   I have reviewed the triage vital signs and the nursing notes.   HISTORY  Chief Complaint Headache and Abdominal Pain     HPI Krista [facility] is a 24 y.o. female who reports she has been having headache, abdominal pain, nausea, and some vomiting recently and now this morning she has began to have diarrhea.  This Krista [facility] has a history of migraine headaches and endometriosis. She says her headache is similar to prior migraines, but the abdominal pain she is having is different from prior events with endometriosis. She is hurting across her lower abdomen primarily, with some discomfort in her upper abdomen.  She has had 4 episodes of for very diarrhea this morning.  [facility] and has an appointment pending this Friday.    Past Medical History  Diagnosis Date  . Migraine   . Headache   . Endometriosis     Patient Active Problem List   Diagnosis Date Noted  . Abdominal pain, unspecified site 06/18/2013  . Hx of migraine headaches 06/18/2013    Past Surgical History  Procedure Laterality Date  . Wisdom tooth extraction      Current Outpatient Rx  Name  Route  Sig  Dispense  Refill  . norethindrone (AYGESTIN) 5 MG tablet   Oral   Take 1 tablet by mouth daily.         . Alum & Mag Hydroxide-Simeth (MAGIC MOUTHWASH) SOLN   Oral   Take 5 mLs by mouth 3 (three) times daily as needed for mouth pain. Patient not taking: Reported on 01/10/2015   30 mL   0   . amoxicillin (AMOXIL) 500 MG capsule   Oral   Take 1 capsule (500 mg total) by mouth 3 (three) times daily. Patient not taking: Reported on 01/10/2015   21 capsule   0   . ciprofloxacin (CIPRO) 500 MG tablet   Oral   Take 1 tablet (500 mg total) by mouth 2 (two) times daily.   14 tablet   0   . metoCLOPramide (REGLAN) 5 MG tablet  Oral   Take 1 tablet (5 mg total) by mouth 3 (three) times daily.   15 tablet   0     Allergies Zoloft  Family History  Problem Relation Age of Onset  . Diabetes Maternal Grandmother   . Diabetes Maternal Grandfather   . Cancer Paternal Grandmother     breast  . Anemia Mother     Social History History  Substance Use Topics  . Smoking status: Never Smoker   . Smokeless tobacco: Never Used  . Alcohol Use: No    Review of Systems  Constitutional: Negative for fever. ENT: Negative for sore throat. Cardiovascular: Negative for chest pain. Respiratory: Negative for shortness of breath. Gastrointestinal: Positive for abdominal pain, vomiting and diarrhea. Genitourinary: Negative for dysuria. Musculoskeletal: No myalgias or injuries. Skin: Negative for rash. Neurological: Positive for headaches   10-point ROS otherwise negative.  ____________________________________________   PHYSICAL EXAM:  VITAL SIGNS: ED Triage Vitals  Enc Vitals Group     BP 01/10/15 0800 153/102 mmHg     Pulse Rate 01/10/15 0800 110     Resp 01/10/15 0800 18     Temp 01/10/15 0800 98.6 F (37 C)     Temp Source 01/10/15 0800 Oral     SpO2 01/10/15 0800 97 %  Weight 01/10/15 0800 215 lb (97.523 kg)     Height 01/10/15 0800  (1.651 m)     Head Cir --      Peak Flow --      Pain Score 01/10/15 0801 9     Pain Loc --      Pain Edu? --      Excl. in GC? --     Constitutional:  Alert and oriented. Patient feels a little uncomfortable and has notable abdominal tenderness. ENT   Head: Normocephalic and atraumatic.   Nose: No congestion/rhinnorhea.   Mouth/Throat: Mucous membranes are moist. Cardiovascular: Normal rate, regular rhythm, no murmur noted Respiratory:  Normal respiratory effort, no tachypnea.    Breath sounds are clear and equal bilaterally.  Gastrointestinal: Tenderness through the lower abdomen. Normal bowel sounds. No distention.  Back: No muscle spasm,  no tenderness, no CVA tenderness. Musculoskeletal: No deformity noted. Nontender with normal range of motion in all extremities.  No noted edema. Neurologic:  Normal speech and language. No gross focal neurologic deficits are appreciated.  Skin:  Skin is warm, dry. No rash noted. Psychiatric: Mood and affect are normal. Speech and behavior are normal.  ____________________________________________    LABS (pertinent positives/negatives)  CBC: Normal white blood cell count. Normal hemoglobin. Metabolic panel: CO2 of 19 otherwise normal. Normal renal function. No sign of dehydration. Urinalysis: White blood cells too numerous to count. Pregnancy: Negative ____________________________________________   INITIAL IMPRESSION / ASSESSMENT AND PLAN / ED COURSE  Pertinent labs & imaging results that were available during my care of the patient were reviewed by me and considered in my medical decision making (see chart for details).  Patient is with diffuse lower abdominal pain and diarrhea. We will check blood tests and treat her with 1 L of normal saline. I'll treat her with Reglan and low bit of pain medication (Dilaudid 0.5 mg IV).  ----------------------------------------- 11:41 AM on 01/10/2015 -----------------------------------------  Patient reports her nausea has improved but she still has discomfort. Her blood tests overall look okay with a white blood cell count of 4.5. She does have white blood cells too numerous to count in her urine. We'll treat her with ciprofloxacin IV to cover the urinary tract infection but also with the intention of coverage for a gastrointestinal bug if she needs that. We will give her Toradol IV for ongoing discomfort.  ----------------------------------------- 2:02 PM on 01/10/2015 -----------------------------------------  On reexam at 145 the patient felt much better. She is no longer having abdominal pain. She feels comfortable and ready to go home. I  will prescribe Cipro for the urinary tract infection and possible GI infection. I will prescribe Reglan for her nausea. We've discussed follow-up with either her doctor at [facility] (Dr. Jean Rosenthal) or with [facility].  ____________________________________________   FINAL CLINICAL IMPRESSION(S) / ED DIAGNOSES  Final diagnoses:  UTI (lower urinary tract infection)  Diarrhea  Lower abdominal pain      Darien Ramus, MD 01/10/15 1408

## 2015-01-10 NOTE — ED Notes (Signed)
Pt complaining of continued abdominal pain.

## 2015-01-10 NOTE — Discharge Instructions (Signed)
Take Cipro for urinary tract infection. If your diarrhea is from a bacterial cause, it may help with that as well. Take Reglan for nausea. This may help with your abdominal pain as well. Follow-up with Dr. Jean RosenthalJackson or with Dr. Carlynn PurlSowles or at [facility].  Abdominal Pain Many things can cause belly (abdominal) pain. Most times, the belly pain is not dangerous. Many cases of belly pain can be watched and treated at home. HOME CARE   Do not take medicines that help you go poop (laxatives) unless told to by your doctor.  Only take medicine as told by your doctor.  Eat or drink as told by your doctor. Your doctor will tell you if you should be on a special diet. GET HELP IF:  You do not know what is causing your belly pain.  You have belly pain while you are sick to your stomach (nauseous) or have runny poop (diarrhea).  You have pain while you pee or poop.  Your belly pain wakes you up at night.  You have belly pain that gets worse or better when you eat.  You have belly pain that gets worse when you eat fatty foods.  You have a fever. GET HELP RIGHT AWAY IF:   The pain does not go away within 2 hours.  You keep throwing up (vomiting).  The pain changes and is only in the right or left part of the belly.  You have bloody or tarry looking poop. MAKE SURE YOU:   Understand these instructions.  Will watch your condition.  Will get help right away if you are not doing well or get worse. Document Released: 12/18/2007 Document Revised: 07/06/2013 Document Reviewed: 03/10/2013 [facility] Patient Information 2015 [place]. This information is not intended to replace advice given to you by your health care provider. Make sure you discuss any questions you have with your health care provider.  Urinary Tract Infection A urinary tract infection (UTI) can occur any place along the urinary tract. The tract includes the kidneys, ureters, bladder, and urethra. A type of germ called  bacteria often causes a UTI. UTIs are often helped with antibiotic medicine.  HOME CARE   If given, take antibiotics as told by your doctor. Finish them even if you start to feel better.  Drink enough fluids to keep your pee (urine) clear or pale yellow.  Avoid tea, drinks with caffeine, and bubbly (carbonated) drinks.  Pee often. Avoid holding your pee in for a long time.  Pee before and after having sex (intercourse).  Wipe from front to back after you poop (bowel movement) if you are a woman. Use each tissue only once. GET HELP RIGHT AWAY IF:   You have back pain.  You have lower belly (abdominal) pain.  You have chills.  You feel sick to your stomach (nauseous).  You throw up (vomit).  Your burning or discomfort with peeing does not go away.  You have a fever.  Your symptoms are not better in 3 days. MAKE SURE YOU:   Understand these instructions.  Will watch your condition.  Will get help right away if you are not doing well or get worse. Document Released: 12/18/2007 Document Revised: 03/25/2012 Document Reviewed: 01/30/2012 [facility] Patient Information 2015 [place], [place]. This information is not intended to replace advice given to you by your health care provider. Make sure you discuss any questions you have with your health care provider.

## 2015-01-11 ENCOUNTER — Encounter: Payer: Self-pay | Admitting: Emergency Medicine

## 2015-01-11 ENCOUNTER — Emergency Department
Admission: EM | Admit: 2015-01-11 | Discharge: 2015-01-11 | Disposition: A | Discharge status: Home / Self Care | Payer: Federal, State, Local not specified - PPO | Attending: Emergency Medicine | Admitting: Emergency Medicine

## 2015-01-11 DIAGNOSIS — Z792 Long term (current) use of antibiotics: Secondary | ICD-10-CM | POA: Insufficient documentation

## 2015-01-11 DIAGNOSIS — G43911 Migraine, unspecified, intractable, with status migrainosus: Secondary | ICD-10-CM

## 2015-01-11 DIAGNOSIS — N390 Urinary tract infection, site not specified: Secondary | ICD-10-CM | POA: Insufficient documentation

## 2015-01-11 DIAGNOSIS — G43011 Migraine without aura, intractable, with status migrainosus: Secondary | ICD-10-CM | POA: Diagnosis not present

## 2015-01-11 DIAGNOSIS — Z793 Long term (current) use of hormonal contraceptives: Secondary | ICD-10-CM | POA: Insufficient documentation

## 2015-01-11 DIAGNOSIS — Z79899 Other long term (current) drug therapy: Secondary | ICD-10-CM | POA: Diagnosis not present

## 2015-01-11 DIAGNOSIS — R51 Headache: Secondary | ICD-10-CM | POA: Diagnosis present

## 2015-01-11 MED ORDER — KETOROLAC TROMETHAMINE 30 MG/ML IJ SOLN
INTRAMUSCULAR | Status: AC
  Administered 2015-01-11: 30 mg via INTRAVENOUS
  Filled 2015-01-11: qty 1

## 2015-01-11 MED ORDER — ONDANSETRON 8 MG PO TBDP
ORAL_TABLET | ORAL | Status: AC
  Filled 2015-01-11: qty 1

## 2015-01-11 MED ORDER — SODIUM CHLORIDE 0.9 % IV BOLUS (SEPSIS)
1000.0000 mL | Freq: Once | INTRAVENOUS | Status: AC
  Administered 2015-01-11: 1000 mL via INTRAVENOUS

## 2015-01-11 MED ORDER — ONDANSETRON 8 MG PO TBDP
8.0000 mg | ORAL_TABLET | Freq: Once | ORAL | Status: AC
  Administered 2015-01-11: 8 mg via ORAL

## 2015-01-11 MED ORDER — DEXAMETHASONE SODIUM PHOSPHATE 10 MG/ML IJ SOLN
INTRAMUSCULAR | Status: AC
  Administered 2015-01-11: 10 mg via INTRAVENOUS
  Filled 2015-01-11: qty 1

## 2015-01-11 MED ORDER — METOCLOPRAMIDE HCL 5 MG/ML IJ SOLN
10.0000 mg | Freq: Once | INTRAMUSCULAR | Status: AC
  Administered 2015-01-11: 10 mg via INTRAVENOUS

## 2015-01-11 MED ORDER — METOCLOPRAMIDE HCL 5 MG/ML IJ SOLN
INTRAMUSCULAR | Status: AC
  Administered 2015-01-11: 10 mg via INTRAVENOUS
  Filled 2015-01-11: qty 2

## 2015-01-11 MED ORDER — DEXAMETHASONE SODIUM PHOSPHATE 10 MG/ML IJ SOLN
10.0000 mg | Freq: Once | INTRAMUSCULAR | Status: AC
  Administered 2015-01-11: 10 mg via INTRAVENOUS

## 2015-01-11 MED ORDER — MAGNESIUM SULFATE 2 GM/50ML IV SOLN
INTRAVENOUS | Status: AC
  Administered 2015-01-11: 2 g via INTRAVENOUS
  Filled 2015-01-11: qty 50

## 2015-01-11 MED ORDER — KETOROLAC TROMETHAMINE 30 MG/ML IJ SOLN
30.0000 mg | Freq: Once | INTRAMUSCULAR | Status: AC
  Administered 2015-01-11: 30 mg via INTRAVENOUS

## 2015-01-11 MED ORDER — MAGNESIUM SULFATE 2 GM/50ML IV SOLN
2.0000 g | Freq: Once | INTRAVENOUS | Status: AC
  Administered 2015-01-11: 2 g via INTRAVENOUS

## 2015-01-11 NOTE — ED Notes (Signed)
Patient with no complaints at this time. Respirations even and unlabored. Skin warm/dry. Discharge instructions reviewed with patient at this time. Patient given opportunity to voice concerns/ask questions. IV removed per policy and band-aid applied to site. Patient discharged at this time and left Emergency Department with steady gait.  

## 2015-01-11 NOTE — ED Provider Notes (Signed)
[facility] Emergency Department Provider Note  ____________________________________________  Time seen: 20:04  I have reviewed the triage vital signs and the nursing notes.   HISTORY  Chief Complaint Migraine     HPI [facility] is a 24 y.o. female who I saw yesterday with abdominal pain and a headache. She had a notable urinary tract infection with white blood cells too numerous to count on her urine. We treated her with Reglan for nausea as well as an anti-migraine medication. She also received Toradol at that time. She was feeling better at the end of the visit and was discharged home. She reports last night her headache began to worsen. He continues to be severe at this time. She is photophobic. She is having trouble sleeping due to the pain. She denies any fever. She has no nuchal rigidity per report. She has had migraines before and this is similar in character.    Past Medical History  Diagnosis Date  . Migraine   . Headache   . Endometriosis     Patient Active Problem List   Diagnosis Date Noted  . Abdominal pain, unspecified site 06/18/2013  . Hx of migraine headaches 06/18/2013    Past Surgical History  Procedure Laterality Date  . Wisdom tooth extraction      Current Outpatient Rx  Name  Route  Sig  Dispense  Refill  . Alum & Mag Hydroxide-Simeth (MAGIC MOUTHWASH) SOLN   Oral   Take 5 mLs by mouth 3 (three) times daily as needed for mouth pain. Patient not taking: Reported on 01/10/2015   30 mL   0   . amoxicillin (AMOXIL) 500 MG capsule   Oral   Take 1 capsule (500 mg total) by mouth 3 (three) times daily. Patient not taking: Reported on 01/10/2015   21 capsule   0   . ciprofloxacin (CIPRO) 500 MG tablet   Oral   Take 1 tablet (500 mg total) by mouth 2 (two) times daily.   14 tablet   0   . metoCLOPramide (REGLAN) 5 MG tablet   Oral   Take 1 tablet (5 mg total) by mouth 3 (three) times daily.   15 tablet   0   .  norethindrone (AYGESTIN) 5 MG tablet   Oral   Take 1 tablet by mouth daily.           Allergies Zoloft  Family History  Problem Relation Age of Onset  . Diabetes Maternal Grandmother   . Diabetes Maternal Grandfather   . Cancer Paternal Grandmother     breast  . Anemia Mother     Social History History  Substance Use Topics  . Smoking status: Never Smoker   . Smokeless tobacco: Never Used  . Alcohol Use: No    Review of Systems  Constitutional: Negative for fever. ENT: Negative for sore throat. Cardiovascular: Negative for chest pain. Respiratory: Negative for shortness of breath. Gastrointestinal: Negative for abdominal pain, vomiting and diarrhea. Genitourinary: Diagnosed with UTI yesterday. See history of present illness and prior visit notes. Musculoskeletal: No myalgias or injuries. Skin: Negative for rash. Neurological: Notable for recurrent migraine. See history of present illness 10-point ROS otherwise negative.  ____________________________________________   PHYSICAL EXAM:  VITAL SIGNS: ED Triage Vitals  Enc Vitals Group     BP 01/11/15 1733 147/88 mmHg     Pulse Rate 01/11/15 1733 110     Resp 01/11/15 1733 18     Temp 01/11/15 1733 99.7  F (37.6 C)     Temp Source 01/11/15 1733 Oral     SpO2 01/11/15 1733 99 %     Weight 01/11/15 1733 215 lb (97.523 kg)     Height 01/11/15 1733 5\' 5"  (1.651 m)     Head Cir --      Peak Flow --      Pain Score 01/11/15 1734 10     Pain Loc --      Pain Edu? --      Excl. in GC? --     Constitutional:  Alert and oriented. Appears uncomfortable and photophobic. She is communicative and pleasant with an intact thought process and no altered mental status. ENT   Head: Normocephalic and atraumatic.   Nose: No congestion/rhinnorhea. Cardiovascular: Normal rate, regular rhythm, no murmur noted Respiratory:  Normal respiratory effort, no tachypnea.    Breath sounds are clear and equal bilaterally.   Gastrointestinal: Soft and nontender. No distention.  Back: No muscle spasm, no tenderness, no CVA tenderness. Musculoskeletal: No deformity noted. Nontender with normal range of motion in all extremities.  No noted edema. Neurologic:  Normal speech and language. No gross focal neurologic deficits are appreciated.  She is ambulatory. She has 5 or 5 strength in all 4 extremities. She has equal grip strength bilaterally, normal pronator drift, and a negative Romberg. Skin:  Skin is warm, dry. No rash noted. Psychiatric: Mood and affect are normal. Speech and behavior are normal.  ____________________________________________  ____________________________________________   INITIAL IMPRESSION / ASSESSMENT AND PLAN / ED COURSE  Pertinent labs & imaging results that were available during my care of the patient were reviewed by me and considered in my medical decision making (see chart for details).  Pleasant 24 year old female who I saw yesterday. She has a history of migraines and is now having an acute migraine. We will once again treated with Reglan, but this time and magnesium, Toradol, and Decadron.  ----------------------------------------- 10:03 PM on 01/11/2015 -----------------------------------------  Patient reports she feels much better. She reports she is ready to go home and rest. I have counseled her on the medications we gave her. Hopefully the Decadron will help prevent a return of this current migraine. She can follow with her regular physician.  ____________________________________________   FINAL CLINICAL IMPRESSION(S) / ED DIAGNOSES  Final diagnoses:  Intractable migraine with status migrainosus, unspecified migraine type      Darien Ramusavid W Josephyne Tarter, MD 01/11/15 2205

## 2015-01-11 NOTE — ED Notes (Signed)
Pt c/o migraine headache with nausea since Sunday with hx of migraines, states she was seen in ED yesterday with abd. Pain and dx with UTI and placed on antibiotics, states she had migraine yesterday but states "they were more focused on my UTI"

## 2015-01-11 NOTE — Discharge Instructions (Signed)

## 2015-01-16 ENCOUNTER — Observation Stay
Admission: EM | Admit: 2015-01-16 | Discharge: 2015-01-21 | Disposition: A | Discharge status: Home / Self Care | Payer: Federal, State, Local not specified - PPO | Attending: Surgery | Admitting: Surgery

## 2015-01-16 ENCOUNTER — Encounter: Payer: Self-pay | Admitting: Occupational Medicine

## 2015-01-16 DIAGNOSIS — Z793 Long term (current) use of hormonal contraceptives: Secondary | ICD-10-CM | POA: Diagnosis not present

## 2015-01-16 DIAGNOSIS — N809 Endometriosis, unspecified: Secondary | ICD-10-CM | POA: Diagnosis not present

## 2015-01-16 DIAGNOSIS — R112 Nausea with vomiting, unspecified: Secondary | ICD-10-CM | POA: Diagnosis not present

## 2015-01-16 DIAGNOSIS — Z79899 Other long term (current) drug therapy: Secondary | ICD-10-CM | POA: Diagnosis not present

## 2015-01-16 DIAGNOSIS — N12 Tubulo-interstitial nephritis, not specified as acute or chronic: Secondary | ICD-10-CM

## 2015-01-16 DIAGNOSIS — R1031 Right lower quadrant pain: Secondary | ICD-10-CM | POA: Diagnosis not present

## 2015-01-16 DIAGNOSIS — K3580 Unspecified acute appendicitis: Principal | ICD-10-CM | POA: Diagnosis present

## 2015-01-16 DIAGNOSIS — R109 Unspecified abdominal pain: Secondary | ICD-10-CM

## 2015-01-16 LAB — COMPREHENSIVE METABOLIC PANEL
ALT: 23 U/L (ref 14–54)
ANION GAP: 8 (ref 5–15)
AST: 22 U/L (ref 15–41)
Albumin: 3.7 g/dL (ref 3.5–5.0)
Alkaline Phosphatase: 110 U/L (ref 38–126)
BUN: 6 mg/dL (ref 6–20)
CALCIUM: 8.9 mg/dL (ref 8.9–10.3)
CO2: 24 mmol/L (ref 22–32)
Chloride: 105 mmol/L (ref 101–111)
Creatinine, Ser: 0.65 mg/dL (ref 0.44–1.00)
GFR calc non Af Amer: >60 mL/min (ref >60)
Glucose, Bld: 114 mg/dL — ABNORMAL HIGH (ref 65–99)
Potassium: 3.4 mmol/L — ABNORMAL LOW (ref 3.5–5.1)
Sodium: 137 mmol/L (ref 135–145)
Total Bilirubin: 0.4 mg/dL (ref 0.3–1.2)
Total Protein: 7.6 g/dL (ref 6.5–8.1)

## 2015-01-16 LAB — CBC WITH DIFFERENTIAL/PLATELET
Basophils Absolute: 0 10*3/uL (ref 0–0.1)
Basophils Relative: 0 %
EOS ABS: 0.1 10*3/uL (ref 0–0.7)
EOS PCT: 1 %
HCT: 38.7 % (ref 35.0–47.0)
Hemoglobin: 12.6 g/dL (ref 12.0–16.0)
LYMPHS ABS: 1.9 10*3/uL (ref 1.0–3.6)
Lymphocytes Relative: 22 %
MCH: 27.4 pg (ref 26.0–34.0)
MCHC: 32.5 g/dL (ref 32.0–36.0)
MCV: 84.3 fL (ref 80.0–100.0)
MONO ABS: 0.5 10*3/uL (ref 0.2–0.9)
Monocytes Relative: 6 %
NEUTROS PCT: 71 %
Neutro Abs: 6.2 10*3/uL (ref 1.4–6.5)
PLATELETS: 233 10*3/uL (ref 150–440)
RBC: 4.59 MIL/uL (ref 3.80–5.20)
RDW: 15.6 % — ABNORMAL HIGH (ref 11.5–14.5)
WBC: 8.7 10*3/uL (ref 3.6–11.0)

## 2015-01-16 LAB — LIPASE, BLOOD: Lipase: 54 U/L — ABNORMAL HIGH (ref 22–51)

## 2015-01-16 MED ORDER — MORPHINE SULFATE 4 MG/ML IJ SOLN
4.0000 mg | Freq: Once | INTRAMUSCULAR | Status: AC
  Administered 2015-01-16: 4 mg via INTRAVENOUS

## 2015-01-16 MED ORDER — ONDANSETRON HCL 4 MG/2ML IJ SOLN
INTRAMUSCULAR | Status: AC
  Administered 2015-01-16: 4 mg via INTRAVENOUS
  Filled 2015-01-16: qty 2

## 2015-01-16 MED ORDER — MORPHINE SULFATE 4 MG/ML IJ SOLN
INTRAMUSCULAR | Status: AC
  Administered 2015-01-16: 4 mg via INTRAVENOUS
  Filled 2015-01-16: qty 1

## 2015-01-16 MED ORDER — ONDANSETRON HCL 4 MG/2ML IJ SOLN
4.0000 mg | Freq: Once | INTRAMUSCULAR | Status: AC
  Administered 2015-01-16: 4 mg via INTRAVENOUS

## 2015-01-16 NOTE — ED Notes (Signed)
Pt came from home via ems with abd pain to left and right lower abd  Radiating to back since 1 pm stabbing pain hx of endometriosis UTI treatment a week ago states it doesn't feel like any of those. Pt walked around place hot towel and tried to lie down no relief. Pt vomited twice today last time at 10pm.

## 2015-01-17 ENCOUNTER — Emergency Department: Payer: Federal, State, Local not specified - PPO

## 2015-01-17 ENCOUNTER — Observation Stay: Payer: Federal, State, Local not specified - PPO | Admitting: Anesthesiology

## 2015-01-17 ENCOUNTER — Encounter
Admission: EM | Disposition: A | Discharge status: Home / Self Care | Payer: Self-pay | Source: Home / Self Care | Attending: Emergency Medicine

## 2015-01-17 DIAGNOSIS — R1031 Right lower quadrant pain: Secondary | ICD-10-CM | POA: Diagnosis not present

## 2015-01-17 DIAGNOSIS — K3580 Unspecified acute appendicitis: Secondary | ICD-10-CM | POA: Diagnosis present

## 2015-01-17 DIAGNOSIS — R112 Nausea with vomiting, unspecified: Secondary | ICD-10-CM | POA: Diagnosis not present

## 2015-01-17 HISTORY — PX: LAPAROSCOPIC APPENDECTOMY: SHX408

## 2015-01-17 LAB — URINALYSIS COMPLETE WITH MICROSCOPIC (ARMC ONLY)
BACTERIA UA: NONE SEEN
Bilirubin Urine: NEGATIVE
Glucose, UA: NEGATIVE mg/dL
Hgb urine dipstick: NEGATIVE
Ketones, ur: NEGATIVE mg/dL
Leukocytes, UA: NEGATIVE
Nitrite: NEGATIVE
PH: 7 (ref 5.0–8.0)
PROTEIN: NEGATIVE mg/dL
RBC / HPF: NONE SEEN RBC/hpf (ref 0–5)
SPECIFIC GRAVITY, URINE: 1.003 — AB (ref 1.005–1.030)
Squamous Epithelial / LPF: NONE SEEN

## 2015-01-17 LAB — PREGNANCY, URINE: Preg Test, Ur: NEGATIVE

## 2015-01-17 SURGERY — APPENDECTOMY, LAPAROSCOPIC
Anesthesia: General

## 2015-01-17 MED ORDER — MORPHINE SULFATE 2 MG/ML IJ SOLN
INTRAMUSCULAR | Status: AC
  Filled 2015-01-17: qty 1

## 2015-01-17 MED ORDER — HYDROMORPHONE HCL 1 MG/ML IJ SOLN
1.0000 mg | INTRAMUSCULAR | Status: DC | PRN
  Administered 2015-01-17 – 2015-01-20 (×10): 1 mg via INTRAVENOUS
  Filled 2015-01-17 (×10): qty 1

## 2015-01-17 MED ORDER — PROPOFOL 10 MG/ML IV BOLUS
INTRAVENOUS | Status: DC | PRN
  Administered 2015-01-17: 160 mg via INTRAVENOUS
  Administered 2015-01-17: 40 mg via INTRAVENOUS

## 2015-01-17 MED ORDER — NORETHINDRONE ACETATE 5 MG PO TABS
5.0000 mg | ORAL_TABLET | Freq: Every day | ORAL | Status: DC
  Administered 2015-01-18 – 2015-01-21 (×4): 5 mg via ORAL
  Filled 2015-01-17: qty 1

## 2015-01-17 MED ORDER — ONDANSETRON HCL 4 MG/2ML IJ SOLN
INTRAMUSCULAR | Status: AC
  Administered 2015-01-17: 4 mg via INTRAVENOUS
  Filled 2015-01-17: qty 2

## 2015-01-17 MED ORDER — SULFAMETHOXAZOLE-TRIMETHOPRIM 800-160 MG PO TABS
ORAL_TABLET | ORAL | Status: AC
Start: 2015-01-17 — End: 2015-01-17
  Administered 2015-01-17: 1 via ORAL
  Filled 2015-01-17: qty 1

## 2015-01-17 MED ORDER — MIDAZOLAM HCL 2 MG/2ML IJ SOLN
INTRAMUSCULAR | Status: DC | PRN
  Administered 2015-01-17: 2 mg via INTRAVENOUS

## 2015-01-17 MED ORDER — MORPHINE SULFATE 4 MG/ML IJ SOLN
4.0000 mg | Freq: Once | INTRAMUSCULAR | Status: AC
Start: 2015-01-17 — End: 2015-01-17
  Administered 2015-01-17: 4 mg via INTRAVENOUS

## 2015-01-17 MED ORDER — SODIUM CHLORIDE 0.9 % IV SOLN
1.0000 g | INTRAVENOUS | Status: DC
  Administered 2015-01-17 – 2015-01-19 (×3): 1 g via INTRAVENOUS
  Filled 2015-01-17 (×4): qty 1

## 2015-01-17 MED ORDER — ROCURONIUM BROMIDE 100 MG/10ML IV SOLN
INTRAVENOUS | Status: DC | PRN
  Administered 2015-01-17: 30 mg via INTRAVENOUS

## 2015-01-17 MED ORDER — SODIUM CHLORIDE 0.9 % IR SOLN
Status: DC | PRN
  Administered 2015-01-17: 1000 mL

## 2015-01-17 MED ORDER — IOHEXOL 300 MG/ML  SOLN
100.0000 mL | Freq: Once | INTRAMUSCULAR | Status: AC | PRN
  Administered 2015-01-17: 100 mL via INTRAVENOUS

## 2015-01-17 MED ORDER — KETOROLAC TROMETHAMINE 10 MG PO TABS: 10.0000 mg | ORAL_TABLET | Freq: Three times a day (TID) | ORAL | Status: DC | PRN

## 2015-01-17 MED ORDER — ACETAMINOPHEN 325 MG PO TABS: 650.0000 mg | ORAL_TABLET | Freq: Four times a day (QID) | ORAL | Status: DC | PRN

## 2015-01-17 MED ORDER — FENTANYL CITRATE (PF) 100 MCG/2ML IJ SOLN
INTRAMUSCULAR | Status: DC | PRN
  Administered 2015-01-17 (×6): 50 ug via INTRAVENOUS

## 2015-01-17 MED ORDER — MORPHINE SULFATE 2 MG/ML IJ SOLN
INTRAMUSCULAR | Status: AC
  Administered 2015-01-17: 2 mg via INTRAVENOUS
  Filled 2015-01-17: qty 1

## 2015-01-17 MED ORDER — ONDANSETRON HCL 4 MG/2ML IJ SOLN
4.0000 mg | Freq: Four times a day (QID) | INTRAMUSCULAR | Status: DC | PRN
  Administered 2015-01-17 – 2015-01-19 (×6): 4 mg via INTRAVENOUS
  Filled 2015-01-17 (×4): qty 2

## 2015-01-17 MED ORDER — MORPHINE SULFATE 2 MG/ML IJ SOLN
2.0000 mg | Freq: Once | INTRAMUSCULAR | Status: AC
Start: 2015-01-17 — End: 2015-01-17
  Administered 2015-01-17: 2 mg via INTRAVENOUS

## 2015-01-17 MED ORDER — BUPIVACAINE HCL (PF) 0.25 % IJ SOLN
INTRAMUSCULAR | Status: DC | PRN
  Administered 2015-01-17: 30 mL

## 2015-01-17 MED ORDER — ACETAMINOPHEN 650 MG RE SUPP: 650.0000 mg | Freq: Four times a day (QID) | RECTAL | Status: DC | PRN

## 2015-01-17 MED ORDER — LIDOCAINE HCL (CARDIAC) 20 MG/ML IV SOLN
INTRAVENOUS | Status: DC | PRN
  Administered 2015-01-17: 80 mg via INTRAVENOUS

## 2015-01-17 MED ORDER — HEPARIN SODIUM (PORCINE) 5000 UNIT/ML IJ SOLN
5000.0000 [IU] | Freq: Three times a day (TID) | INTRAMUSCULAR | Status: DC
  Administered 2015-01-17 – 2015-01-21 (×10): 5000 [IU] via SUBCUTANEOUS
  Filled 2015-01-17 (×10): qty 1

## 2015-01-17 MED ORDER — SULFAMETHOXAZOLE-TRIMETHOPRIM 800-160 MG PO TABS: 1.0000 | ORAL_TABLET | Freq: Two times a day (BID) | ORAL | Status: DC

## 2015-01-17 MED ORDER — ONDANSETRON HCL 4 MG/2ML IJ SOLN: 4.0000 mg | Freq: Once | INTRAMUSCULAR | Status: AC | PRN

## 2015-01-17 MED ORDER — SULFAMETHOXAZOLE-TRIMETHOPRIM 800-160 MG PO TABS
1.0000 | ORAL_TABLET | Freq: Two times a day (BID) | ORAL | Status: DC
  Administered 2015-01-17: 1 via ORAL

## 2015-01-17 MED ORDER — ONDANSETRON HCL 4 MG/2ML IJ SOLN
INTRAMUSCULAR | Status: AC
  Filled 2015-01-17: qty 2

## 2015-01-17 MED ORDER — KETOROLAC TROMETHAMINE 10 MG PO TABS
ORAL_TABLET | ORAL | Status: AC
  Administered 2015-01-17: 10 mg via ORAL
  Filled 2015-01-17: qty 1

## 2015-01-17 MED ORDER — LACTATED RINGERS IV SOLN
INTRAVENOUS | Status: DC | PRN
  Administered 2015-01-17: 13:00:00 via INTRAVENOUS

## 2015-01-17 MED ORDER — MORPHINE SULFATE 2 MG/ML IJ SOLN
2.0000 mg | INTRAMUSCULAR | Status: DC | PRN
  Administered 2015-01-17: 2 mg via INTRAVENOUS

## 2015-01-17 MED ORDER — SODIUM CHLORIDE 0.9 % IV SOLN
INTRAVENOUS | Status: DC | PRN
  Administered 2015-01-17: 1 mL via INTRAMUSCULAR

## 2015-01-17 MED ORDER — PANTOPRAZOLE SODIUM 40 MG IV SOLR: 40.0000 mg | Freq: Every day | INTRAVENOUS | Status: DC

## 2015-01-17 MED ORDER — GLYCOPYRROLATE 0.2 MG/ML IJ SOLN
INTRAMUSCULAR | Status: DC | PRN
  Administered 2015-01-17: 0.6 mg via INTRAVENOUS

## 2015-01-17 MED ORDER — KETOROLAC TROMETHAMINE 30 MG/ML IJ SOLN
INTRAMUSCULAR | Status: DC | PRN
  Administered 2015-01-17: 30 mg via INTRAVENOUS

## 2015-01-17 MED ORDER — NEOSTIGMINE METHYLSULFATE 10 MG/10ML IV SOLN
INTRAVENOUS | Status: DC | PRN
  Administered 2015-01-17: 4 mg via INTRAVENOUS

## 2015-01-17 MED ORDER — OXYCODONE-ACETAMINOPHEN 5-325 MG PO TABS
1.0000 | ORAL_TABLET | Freq: Four times a day (QID) | ORAL | Status: DC | PRN
  Administered 2015-01-18 (×2): 1 via ORAL
  Administered 2015-01-19 – 2015-01-21 (×8): 2 via ORAL
  Filled 2015-01-17 (×5): qty 2
  Filled 2015-01-17: qty 1
  Filled 2015-01-17 (×2): qty 2
  Filled 2015-01-17: qty 1
  Filled 2015-01-17 (×2): qty 2

## 2015-01-17 MED ORDER — IOHEXOL 240 MG/ML SOLN
25.0000 mL | Freq: Once | INTRAMUSCULAR | Status: AC | PRN
  Administered 2015-01-17: 25 mL via ORAL

## 2015-01-17 MED ORDER — ONDANSETRON HCL 4 MG/2ML IJ SOLN
4.0000 mg | Freq: Once | INTRAMUSCULAR | Status: AC
  Administered 2015-01-17: 4 mg via INTRAVENOUS

## 2015-01-17 MED ORDER — MORPHINE SULFATE 4 MG/ML IJ SOLN
INTRAMUSCULAR | Status: AC
  Administered 2015-01-17: 4 mg via INTRAVENOUS
  Filled 2015-01-17: qty 1

## 2015-01-17 MED ORDER — MORPHINE SULFATE 4 MG/ML IJ SOLN: 4.0000 mg | Freq: Once | INTRAMUSCULAR | Status: DC

## 2015-01-17 MED ORDER — SODIUM CHLORIDE 0.9 % IV SOLN
INTRAVENOUS | Status: DC
  Administered 2015-01-17 – 2015-01-20 (×6): via INTRAVENOUS

## 2015-01-17 MED ORDER — FAMOTIDINE 20 MG PO TABS
20.0000 mg | ORAL_TABLET | Freq: Every day | ORAL | Status: DC
  Administered 2015-01-17 – 2015-01-21 (×5): 20 mg via ORAL
  Filled 2015-01-17 (×5): qty 1

## 2015-01-17 MED ORDER — FENTANYL CITRATE (PF) 100 MCG/2ML IJ SOLN
25.0000 ug | INTRAMUSCULAR | Status: DC | PRN
  Administered 2015-01-17 (×4): 25 ug via INTRAVENOUS

## 2015-01-17 MED ORDER — KETOROLAC TROMETHAMINE 10 MG PO TABS
10.0000 mg | ORAL_TABLET | Freq: Once | ORAL | Status: AC
  Administered 2015-01-17: 10 mg via ORAL

## 2015-01-17 MED ORDER — ONDANSETRON HCL 4 MG/2ML IJ SOLN: 4.0000 mg | Freq: Once | INTRAMUSCULAR | Status: DC

## 2015-01-17 SURGICAL SUPPLY — 33 items
CANISTER SUCT 3000ML (MISCELLANEOUS) ×2 IMPLANT
CHLORAPREP W/TINT 26ML (MISCELLANEOUS) ×2 IMPLANT
CUTTER LINEAR ENDO 35 ART THIN (STAPLE) ×2 IMPLANT
DRSG TEGADERM 2-3/8X2-3/4 SM (GAUZE/BANDAGES/DRESSINGS) ×6 IMPLANT
DRSG TELFA 3X8 NADH (GAUZE/BANDAGES/DRESSINGS) ×2 IMPLANT
GLOVE BIO SURGEON STRL SZ7.5 (GLOVE) ×6 IMPLANT
GLOVE INDICATOR 8.0 STRL GRN (GLOVE) ×2 IMPLANT
GOWN STRL REUS W/ TWL LRG LVL3 (GOWN DISPOSABLE) ×2 IMPLANT
GOWN STRL REUS W/TWL LRG LVL3 (GOWN DISPOSABLE) ×2
GRASPER SUT TROCAR 14GX15 (MISCELLANEOUS) ×2 IMPLANT
IRRIGATION STRYKERFLOW (MISCELLANEOUS) ×1 IMPLANT
IRRIGATOR STRYKERFLOW (MISCELLANEOUS) ×2
IV NS 1000ML (IV SOLUTION) ×2
IV NS 1000ML BAXH (IV SOLUTION) ×2 IMPLANT
KIT RM TURNOVER STRD PROC AR (KITS) ×2 IMPLANT
NDL SAFETY 25GX1.5 (NEEDLE) ×2 IMPLANT
NEEDLE FILTER BLUNT 18X 1/2SAF (NEEDLE) ×1
NEEDLE FILTER BLUNT 18X1 1/2 (NEEDLE) ×1 IMPLANT
NEEDLE INSUFFLATION 14GA 120MM (NEEDLE) ×2 IMPLANT
NS IRRIG 500ML POUR BTL (IV SOLUTION) ×2 IMPLANT
PACK LAP CHOLECYSTECTOMY (MISCELLANEOUS) ×2 IMPLANT
PAD GROUND ADULT SPLIT (MISCELLANEOUS) ×2 IMPLANT
POUCH ENDO CATCH 10MM SPEC (MISCELLANEOUS) ×2 IMPLANT
RELOAD /EVU35 (ENDOMECHANICALS) ×2 IMPLANT
RELOAD CUTTER ETS 35MM STAND (ENDOMECHANICALS) ×2 IMPLANT
SCISSORS METZENBAUM CVD 33 (INSTRUMENTS) ×2 IMPLANT
SUT ETHILON 5-0 FS-2 18 BLK (SUTURE) ×4 IMPLANT
SUT VIC AB 0 CT2 27 (SUTURE) ×2 IMPLANT
SYRINGE 10CC LL (SYRINGE) ×2 IMPLANT
TROCAR XCEL 12X100 BLDLESS (ENDOMECHANICALS) ×2 IMPLANT
TROCAR Z-THREAD FIOS 11X100 BL (TROCAR) ×2 IMPLANT
TROCAR Z-THREAD SLEEVE 11X100 (TROCAR) ×2 IMPLANT
TUBING INSUFFLATOR HI FLOW (MISCELLANEOUS) ×2 IMPLANT

## 2015-01-17 NOTE — ED Notes (Signed)
Patient transported to CT 

## 2015-01-17 NOTE — ED Provider Notes (Addendum)
[facility] Emergency Department Provider Note  ____________________________________________  Time seen: 12:00 AM  I have reviewed the triage vital signs and the nursing notes.   HISTORY  Chief Complaint Abdominal Pain      HPI [facility] is a 24 y.o. female presents with right flank/right lower quadrant pain with onset 1 PM yesterday. Patient admits to recently being treated for urinary tract infection approximately one week ago. Patient also admits to emesis times twice that was nonbloody. Patient denies any current dysuria and no fever.      Past Medical History  Diagnosis Date  . Migraine   . Headache   . Endometriosis     Patient Active Problem List   Diagnosis Date Noted  . Abdominal pain, unspecified site 06/18/2013  . Hx of migraine headaches 06/18/2013    Past Surgical History  Procedure Laterality Date  . Wisdom tooth extraction      Current Outpatient Rx  Name  Route  Sig  Dispense  Refill  . Alum & Mag Hydroxide-Simeth (MAGIC MOUTHWASH) SOLN   Oral   Take 5 mLs by mouth 3 (three) times daily as needed for mouth pain. Patient not taking: Reported on 01/10/2015   30 mL   0   . amoxicillin (AMOXIL) 500 MG capsule   Oral   Take 1 capsule (500 mg total) by mouth 3 (three) times daily. Patient not taking: Reported on 01/10/2015   21 capsule   0   . ciprofloxacin (CIPRO) 500 MG tablet   Oral   Take 1 tablet (500 mg total) by mouth 2 (two) times daily.   14 tablet   0   . metoCLOPramide (REGLAN) 5 MG tablet   Oral   Take 1 tablet (5 mg total) by mouth 3 (three) times daily.   15 tablet   0   . norethindrone (AYGESTIN) 5 MG tablet   Oral   Take 1 tablet by mouth daily.           Allergies Zoloft  Family History  Problem Relation Age of Onset  . Diabetes Maternal Grandmother   . Diabetes Maternal Grandfather   . Cancer Paternal Grandmother     breast  . Anemia Mother     Social History History   Substance Use Topics  . Smoking status: Never Smoker   . Smokeless tobacco: Never Used  . Alcohol Use: No    Review of Systems  Constitutional: Negative for fever. Eyes: Negative for visual changes. ENT: Negative for sore throat. Cardiovascular: Negative for chest pain. Respiratory: Negative for shortness of breath. Gastrointestinal: Positive for abdominal pain, vomiting and diarrhea. Genitourinary: Negative for dysuria. Musculoskeletal: Negative for back pain. Skin: Negative for rash. Neurological: Negative for headaches, focal weakness or numbness.   10-point ROS otherwise negative.  ____________________________________________   PHYSICAL EXAM:  VITAL SIGNS: ED Triage Vitals  Enc Vitals Group     BP 01/16/15 2250 145/89 mmHg     Pulse Rate 01/16/15 2250 99     Resp 01/16/15 2250 22     Temp 01/16/15 2250 98.6 F (37 C)     Temp Source 01/16/15 2250 Oral     SpO2 01/16/15 2245 96 %     Weight 01/16/15 2250 215 lb (97.523 kg)     Height 01/16/15 2250  (1.676 m)     Head Cir --      Peak Flow --      Pain Score 01/16/15 2250 10  Pain Loc --      Pain Edu? --      Excl. in GC? --      Constitutional: Alert and oriented. Well appearing and in no distress. Eyes: Conjunctivae are normal. PERRL. Normal extraocular movements. ENT   Head: Normocephalic and atraumatic.   Nose: No congestion/rhinnorhea.   Mouth/Throat: Mucous membranes are moist.   Neck: No stridor. Cardiovascular: Normal rate, regular rhythm. Normal and symmetric distal pulses are present in all extremities. No murmurs, rubs, or gallops. Respiratory: Normal respiratory effort without tachypnea nor retractions. Breath sounds are clear and equal bilaterally. No wheezes/rales/rhonchi. Gastrointestinal: Soft and nontender. No distention. There is no CVA tenderness. Genitourinary: deferred Musculoskeletal: Nontender with normal range of motion in all extremities. No joint effusions.   No lower extremity tenderness nor edema. Neurologic:  Normal speech and language. No gross focal neurologic deficits are appreciated. Speech is normal.  Skin:  Skin is warm, dry and intact. No rash noted. Psychiatric: Mood and affect are normal. Speech and behavior are normal. Patient exhibits appropriate insight and judgment.  ____________________________________________    LABS (pertinent positives/negatives)  Labs Reviewed  CBC WITH DIFFERENTIAL/PLATELET - Abnormal; Notable for the following:    RDW 15.6 (*)    All other components within normal limits  COMPREHENSIVE METABOLIC PANEL - Abnormal; Notable for the following:    Potassium 3.4 (*)    Glucose, Bld 114 (*)    All other components within normal limits  LIPASE, BLOOD - Abnormal; Notable for the following:    Lipase 54 (*)    All other components within normal limits  URINALYSIS COMPLETEWITH MICROSCOPIC ([facility] ONLY) - Abnormal; Notable for the following:    Color, Urine STRAW (*)    APPearance CLEAR (*)    Specific Gravity, Urine 1.003 (*)    All other components within normal limits  URINE CULTURE  PREGNANCY, URINE       RADIOLOGY  US abdomen: Negative per radiologist  ____________________________________________   CT Abdomen and Pelvis IMPRESSION: Acute uncomplicated appendicitis.  Small amount of free fluid in the pelvis may be physiologic or reactive. Mild mesenteric lymphadenopathy is likely reactive.  No urolithiasis or obstructive uropathy.  Acute findings discussed with and re[facility] by [facility] Alyssah Algeo on 01/17/2015 at 4:43 am.    INITIAL IMPRESSION / ASSESSMENT AND PLAN / ED COURSE  Pertinent labs & imaging results that were available during my care of the patient were reviewed by me and considered in my medical decision making (see chart for details).    ____________________________________________   FINAL CLINICAL IMPRESSION(S) / ED DIAGNOSES  Final diagnoses:  Pyelonephritis   Acute appendicitis, unspecified acute appendicitis type      Darci Currentandolph N Tonita Bills, MD 01/17/15 [id number]  Darci Currentandolph N Zaccary Creech, MD 01/18/15 [id number]

## 2015-01-17 NOTE — H&P (Signed)
CC: RLQ pain, nausea/vomiting x 1 day  HPI:  Ms. Krista Drake is a pleasant 24 yo F who presents with 1 day of RLQ pain, nausea and vomiting.  Was doing well until yesterday.  Pain worsening.  Presented to ER 1 week ago and has been treated for UTI.  2 more days of antibiotics pending.  Pain feels different.  Did have recent diarrhea which has improved.  No fevers/chills, night sweats, shortness of breath, cough, chest pain, dysuria/hematuria.  Active Ambulatory Problems    Diagnosis Date Noted  . Abdominal pain, unspecified site 06/18/2013  . Hx of migraine headaches 06/18/2013   Resolved Ambulatory Problems    Diagnosis Date Noted  . No Resolved Ambulatory Problems   Past Medical History  Diagnosis Date  . Migraine   . Headache   . Endometriosis    H/o fitz-hugh curtis    Medication List    TAKE these medications        ketorolac 10 MG tablet  Commonly known as:  TORADOL  Take 1 tablet (10 mg total) by mouth every 8 (eight) hours as needed.     sulfamethoxazole-trimethoprim 800-160 MG per tablet  Commonly known as:  BACTRIM DS,SEPTRA DS  Take 1 tablet by mouth 2 (two) times daily.      ASK your doctor about these medications        amoxicillin 500 MG capsule  Commonly known as:  AMOXIL  Take 1 capsule (500 mg total) by mouth 3 (three) times daily.     ciprofloxacin 500 MG tablet  Commonly known as:  CIPRO  Take 1 tablet (500 mg total) by mouth 2 (two) times daily.     magic mouthwash Soln  Take 5 mLs by mouth 3 (three) times daily as needed for mouth pain.     metoCLOPramide 5 MG tablet  Commonly known as:  REGLAN  Take 1 tablet (5 mg total) by mouth 3 (three) times daily.     norethindrone 5 MG tablet  Commonly known as:  AYGESTIN  Take 1 tablet by mouth daily.       Allergies  Allergen Reactions  . Zoloft [Sertraline Hcl] Hives   History   Social History  . Marital Status: Single    Spouse Name: N/A  . Number of Children: N/A  . Years of Education:  N/A   Occupational History  . Not on file.   Social History Main Topics  . Smoking status: Never Smoker   . Smokeless tobacco: Never Used  . Alcohol Use: No  . Drug Use: No  . Sexual Activity: Not on file   Other Topics Concern  . Not on file   Social History Narrative   Family History  Problem Relation Age of Onset  . Diabetes Maternal Grandmother   . Diabetes Maternal Grandfather   . Cancer Paternal Grandmother     breast  . Anemia Mother    ROS: Full ROS obtained, pertinent positives and negatives per HPI  Blood pressure 128/62, pulse 93, temperature 98.4 F (36.9 C), temperature source Oral, resp. rate 18, height 5\' 6"  (1.676 m), weight 97.523 kg (215 lb), last menstrual period 01/25/2013, SpO2 97 %. GEN: NAD/A&Ox3 FACE: no obvious facial trauma, normal external nose, normal external ears EYES: no scleral icterus, no conjunctivitis HEAD: normocephalic atraumatic CV: RRR, no MRG RESP: moving air well, lungs clear ABD: soft, tender to palpation RLQ, nondistended EXT: moving all ext well, strength 5/5 NEURO: cnII-XII grossly intact, sensation intact all 4  ext  Labs:  Reviewed, significant for  WBC 8.7, 71% neutrophils UA - negative  CT: reviewed Dilated appendix, appendicolith, mild proximal appendiceal inflammatory changes  A/P 24 yo F who presents with acute RLQ pain, nausea/vomiting and CT showing dilated appendix with periappendiceal stranding.  Clinical and radiographic appendicitis.  Have recommended appendectomy.  I have explained the benefits and risks associated with surgery and she would like to proceed.  Will leave final discretion to daytime surgeon Dr. Michela Pitcher.

## 2015-01-17 NOTE — Progress Notes (Signed)

## 2015-01-17 NOTE — Transfer of Care (Signed)
Immediate Anesthesia Transfer of Care Note  Patient: Krista Drake  Procedure(s) Performed: Procedure(s): APPENDECTOMY LAPAROSCOPIC (N/A)  Patient Location: PACU  Anesthesia Type:General  Level of Consciousness: sedated  Airway & Oxygen Therapy: Patient Spontanous Breathing and Patient connected to face mask oxygen  Post-op Assessment: Report given to RN and Post -op Vital signs reviewed and stable  Post vital signs: Reviewed and stable  Last Vitals:  Filed Vitals:   01/17/15 1433  BP: 132/64  Pulse: 64  Temp: 36.3 C  Resp: 9    Complications: No apparent anesthesia complications

## 2015-01-17 NOTE — ED Notes (Signed)
MD at bedside. 

## 2015-01-17 NOTE — Anesthesia Preprocedure Evaluation (Addendum)
Anesthesia Evaluation  Patient identified by MRN, date of birth, ID band Patient awake    Reviewed: Allergy & Precautions, H&P , NPO status , Patient's Chart, lab work & pertinent test results, reviewed documented beta blocker date and time   Airway Mallampati: II  TM Distance: >3 FB Neck ROM: full    Dental   Pulmonary Current Smoker,          Cardiovascular Rate:Normal     Neuro/Psych  Headaches,    GI/Hepatic   Endo/Other    Renal/GU      Musculoskeletal   Abdominal   Peds  Hematology   Anesthesia Other Findings   Reproductive/Obstetrics endometriosis                            Anesthesia Physical Anesthesia Plan  ASA: II  Anesthesia Plan: General ETT   Post-op Pain Management:    Induction:   Airway Management Planned:   Additional Equipment:   Intra-op Plan:   Post-operative Plan:   Informed Consent: I have reviewed the patients History and Physical, chart, labs and discussed the procedure including the risks, benefits and alternatives for the proposed anesthesia with the patient or authorized representative who has indicated his/her understanding and acceptance.     Plan Discussed with: CRNA  Anesthesia Plan Comments:         Anesthesia Quick Evaluation

## 2015-01-17 NOTE — Op Note (Signed)
01/16/2015 - 01/17/2015  2:29 PM  PATIENT:  Krista Drake  24 y.o. female  PRE-OPERATIVE DIAGNOSIS:  appendicitis  POST-OPERATIVE DIAGNOSIS:  appendicitis  PROCEDURE:  Procedure(s): APPENDECTOMY LAPAROSCOPIC (N/A)  SURGEON:  Surgeon(s) and Role:    * Tiney Rougealph Ely III, MD - Primary   ASSISTANTS: none   ANESTHESIA:   general  EBL:  Total I/O In: 700 [I.V.:700] Out: 560 [Urine:550; Blood:10]   DRAINS: none   LOCAL MEDICATIONS USED:  BUPIVICAINE    DISPOSITION OF SPECIMEN:  PATHOLOGY   DICTATION: .Dragon Dictation with the patient supine position infection appropriate general anesthesia the patient's abdomen was prepped ChloraPrep and draped sterile towels. The patient's place headdown feet up position. A small transverse infraumbilical incision was made in the standard fashion carried down bluntly to subcutaneous tissue. She had a previous midline surgery so we used the transverse incision. A varies needle was used to cannulate peritoneal cavity. CO2 was insufflated to appropriate pressure measurements.  Slightly to the left liters of CO2 were instilled a varies needle was withdrawn and a 11 mm port placed into the peritoneal cavity. Intraperitoneal position was confirmed and CO2 was reinsufflated. The abdomen was inspected. It appeared to be some thickening and inflammatory change and right lower quadrant. Transverse incision was made in the midepigastrium and a 11 mm port inserted under direct vision. A suprapubic port was placed using a 12 mm port under direct vision.  The right lower quadrant was investigated. The appendix was thickened and dilated injected without evidence of perforation. The mesoappendix is divided with 2 applications of the Endo GIA staple device carrying a white load and the base appendix is inhibited with single application of the Endo GIA stapler carrying a blue load. There were multiple adhesions to the cecum which were taken down with blunt and Bovie  dissection. Appendix was captured in an Endo Catch apparatus and removed.  The abdomen was then copiously irrigated with warm saline solution. The subumbilical port was removed and the incision closed using figure-of-eight suture of 0 Vicryl and the suture passer. Abdomen is then desufflated. All ports control without difficulty. Skin incisions were closed with 5-0 nylon. The area was infiltrated with 0.25% Marcaine for postoperative pain control. Sterile dressings were applied. The patient was then returned recovery room having tolerated procedure well. Sponge is for needle count correct 2 in the operative.   PLAN OF CARE: Admit for overnight observation  PATIENT DISPOSITION:  PACU - hemodynamically stable.   Tiney Rougealph Ely III, MD

## 2015-01-18 NOTE — Progress Notes (Signed)
1 Day Post-Op   Subjective:  She continues to have incisional pain but her abdominal pain from yesterday is much improved. She had some nausea last night but is overall eating better. She has not had a bowel movement or passed any gas as yet. She's not been up out of bed.  Vital signs in last 24 hours: Temp:  [97.3 F (36.3 C)-100.1 F (37.8 C)] 98.3 F (36.8 C) (07/06 0729) Pulse Rate:  [63-124] 85 (07/06 0729) Resp:  [9-17] 17 (07/06 0729) BP: (80-170)/(62-86) 137/70 mmHg (07/06 0729) SpO2:  [97 %-100 %] 97 % (07/06 0729)    Intake/Output from previous day: 07/05 0701 - 07/06 0700 In: 1632 [I.V.:1582; IV Piggyback:50] Out: 1010 [Urine:1000; Blood:10]  GI: She has moderate abdominal tenderness in the incisional area but no right lower quadrant pain  Lab Results:  CBC  Recent Labs  01/16/15 2242  WBC 8.7  HGB 12.6  HCT 38.7  PLT 233   CMP     Component Value Date/Time   NA 137 01/16/2015 2242   NA 137 10/30/2014 1724   NA 140 06/16/2013 1225   K 3.4* 01/16/2015 2242   K 3.8 10/30/2014 1724   CL 105 01/16/2015 2242   CL 106 10/30/2014 1724   CO2 24 01/16/2015 2242   CO2 23 10/30/2014 1724   GLUCOSE 114* 01/16/2015 2242   GLUCOSE 90 10/30/2014 1724   GLUCOSE 86 06/16/2013 1225   BUN 6 01/16/2015 2242   BUN 8 10/30/2014 1724   BUN 12 06/16/2013 1225   CREATININE 0.65 01/16/2015 2242   CREATININE 0.72 10/30/2014 1724   CALCIUM 8.9 01/16/2015 2242   CALCIUM 9.0 10/30/2014 1724   PROT 7.6 01/16/2015 2242   PROT 8.5* 10/30/2014 1724   PROT 7.9 06/16/2013 1225   ALBUMIN 3.7 01/16/2015 2242   ALBUMIN 3.9 10/30/2014 1724   AST 22 01/16/2015 2242   AST 26 10/30/2014 1724   ALT 23 01/16/2015 2242   ALT 19 10/30/2014 1724   ALKPHOS 110 01/16/2015 2242   ALKPHOS 140* 10/30/2014 1724   BILITOT 0.4 01/16/2015 2242   GFRNONAA >60 01/16/2015 2242   GFRNONAA >60 10/30/2014 1724   GFRAA >60 01/16/2015 2242   GFRAA >60 10/30/2014 1724   PT/INR No results for  input(s): LABPROT, INR in the last 72 hours.  Studies/Results: Ct Abdomen Pelvis W Contrast  01/17/2015   CLINICAL DATA:  RIGHT flank and RIGHT lower quadrant pain beginning at 1 p.m. yesterday. Treated for urinary tract infection 1 week ago. Non bloody emesis. History of endometriosis and exploratory laparotomy.  EXAM: CT ABDOMEN AND PELVIS WITH CONTRAST  TECHNIQUE: Multidetector CT imaging of the abdomen and pelvis was performed using the standard protocol following bolus administration of intravenous contrast.  CONTRAST:  OMNIPAQUE IOHEXOL 300 MG/ML  SOLN  COMPARISON:  CT abdomen and pelvis October 30, 2014  FINDINGS: LUNG BASES: Included view of the lung bases are clear. Visualized heart and pericardium are unremarkable.  SOLID ORGANS: The liver, spleen, gallbladder, pancreas and adrenal glands are unremarkable.  GASTROINTESTINAL TRACT: The stomach, small and large bowel are normal in course and caliber without inflammatory changes. Enteric contrast has not yet reached the distal small bowel. The appendix is enlarged, 15 mm, 8 mm appendicolith, mild periappendiceal inflammatory changes and proximal appendix wall thickening with hyperemia.  KIDNEYS/ URINARY TRACT: Kidneys are orthotopic, demonstrating symmetric enhancement. No nephrolithiasis, hydronephrosis or solid renal masses. The unopacified ureters are normal in course and caliber. Urinary bladder is  partially distended and unremarkable.  PERITONEUM/RETROPERITONEUM: Aortoiliac vessels are normal in course and caliber. Mild mesenteric lymphadenopathy is likely reactive. Internal reproductive organs are unremarkable. Small amount of free fluid in the pelvis may be physiologic or reactive.  SOFT TISSUE/OSSEOUS STRUCTURES: Non-suspicious. Mild thoracolumbar levoscoliosis.  IMPRESSION: Acute uncomplicated appendicitis.  Small amount of free fluid in the pelvis may be physiologic or reactive. Mild mesenteric lymphadenopathy is likely reactive.  No  urolithiasis or obstructive uropathy.  Acute findings discussed with and reconfirmed by [facility] [place] BR[facility] on 01/17/2015 at 4:43 am.   Electronically Signed   By: Awilda Metroourtnay  Bloomer M.D.   On: 01/17/2015 04:44   [place] Abdomen Limited Ruq  01/17/2015   CLINICAL DATA:  Acute onset of right flank pain for 1 day. Initial encounter.  EXAM: US ABDOMEN LIMITED - RIGHT UPPER QUADRANT  COMPARISON:  None.  FINDINGS: Gallbladder:  No gallstones or wall thickening visualized. No sonographic Murphy sign noted.  Common bile duct:  Diameter: 0.3 cm, within normal limits in caliber.  Liver:  No focal lesion identified. Within normal limits in parenchymal echogenicity.  IMPRESSION: Unremarkable ultrasound of the right upper quadrant.   Electronically Signed   By: Roanna RaiderJeffery  Chang M.D.   On: 01/17/2015 01:17    Assessment/Plan: Overall she is improved. She seems to be recovering uneventfully. She's not ambulating much as she had some we will increase her activity level and likely not get her home until tomorrow. This plan was discussed with her in detail.

## 2015-01-19 LAB — SURGICAL PATHOLOGY

## 2015-01-19 MED ORDER — BISACODYL 5 MG PO TBEC: 5.0000 mg | DELAYED_RELEASE_TABLET | Freq: Every day | ORAL | Status: DC | PRN

## 2015-01-19 MED ORDER — PROMETHAZINE HCL 25 MG/ML IJ SOLN
12.5000 mg | INTRAMUSCULAR | Status: DC | PRN
  Administered 2015-01-19 – 2015-01-20 (×2): 12.5 mg via INTRAVENOUS
  Filled 2015-01-19 (×2): qty 1

## 2015-01-19 NOTE — Progress Notes (Signed)
2 Days Post-Op   Subjective:  She continues to have abdominal pain and nausea. She has no fever. She feels like nausea comes on with her medication and with any sort of attempt at eating.  Vital signs in last 24 hours: Temp:  [97.8 F (36.6 C)-98.3 F (36.8 C)] 98.1 F (36.7 C) (07/07 1522) Pulse Rate:  [69-91] 69 (07/07 1522) Resp:  [16-18] 18 (07/07 1522) BP: (128-139)/(67-74) 137/74 mmHg (07/07 1522) SpO2:  [99 %-100 %] 100 % (07/07 1522) Last BM Date: 01/16/15  Intake/Output from previous day: 07/06 0701 - 07/07 0700 In: 2268 [P.O.:120; I.V.:2148] Out: 850 [Urine:850]  GI: Her abdomen is soft with minimal incisional tenderness. Her wounds look good and dressings were removed. She has active bowel sounds. She's not had a bowel movement as yet.  Lab Results:  CBC  Recent Labs  01/16/15 2242  WBC 8.7  HGB 12.6  HCT 38.7  PLT 233   CMP     Component Value Date/Time   NA 137 01/16/2015 2242   NA 137 10/30/2014 1724   NA 140 06/16/2013 1225   K 3.4* 01/16/2015 2242   K 3.8 10/30/2014 1724   CL 105 01/16/2015 2242   CL 106 10/30/2014 1724   CO2 24 01/16/2015 2242   CO2 23 10/30/2014 1724   GLUCOSE 114* 01/16/2015 2242   GLUCOSE 90 10/30/2014 1724   GLUCOSE 86 06/16/2013 1225   BUN 6 01/16/2015 2242   BUN 8 10/30/2014 1724   BUN 12 06/16/2013 1225   CREATININE 0.65 01/16/2015 2242   CREATININE 0.72 10/30/2014 1724   CALCIUM 8.9 01/16/2015 2242   CALCIUM 9.0 10/30/2014 1724   PROT 7.6 01/16/2015 2242   PROT 8.5* 10/30/2014 1724   PROT 7.9 06/16/2013 1225   ALBUMIN 3.7 01/16/2015 2242   ALBUMIN 3.9 10/30/2014 1724   AST 22 01/16/2015 2242   AST 26 10/30/2014 1724   ALT 23 01/16/2015 2242   ALT 19 10/30/2014 1724   ALKPHOS 110 01/16/2015 2242   ALKPHOS 140* 10/30/2014 1724   BILITOT 0.4 01/16/2015 2242   GFRNONAA >60 01/16/2015 2242   GFRNONAA >60 10/30/2014 1724   GFRAA >60 01/16/2015 2242   GFRAA >60 10/30/2014 1724   PT/INR No results for  input(s): LABPROT, INR in the last 72 hours.  Studies/Results: No results found.  Assessment/Plan: She seems to have postoperative ileus. She does not appear to have any other evidence for abscess formation. We will back off on her antibiotics to see if that may make a difference give her some cathartics for her bowel function. I have encouraged her to take oral pain medicines.

## 2015-01-19 NOTE — Progress Notes (Signed)
Pt c/o nausea and "weakness".  Medicated for nausea at 2102. Pt resting well now.   Cristela FeltHelen Iris Guidry, RN

## 2015-01-19 NOTE — Anesthesia Postprocedure Evaluation (Signed)
  Anesthesia Post-op Note  Patient: Krista Drake  Procedure(s) Performed: Procedure(s): APPENDECTOMY LAPAROSCOPIC (N/A)  Anesthesia type:General ETT  Patient location: PACU  Post pain: Pain level controlled  Post assessment: Post-op Vital signs reviewed, Patient's Cardiovascular Status Stable, Respiratory Function Stable, Patent Airway and No signs of Nausea or vomiting  Post vital signs: Reviewed and stable  Last Vitals:  Filed Vitals:   01/19/15 1522  BP: 137/74  Pulse: 69  Temp: 36.7 C  Resp: 18    Level of consciousness: awake, alert  and patient cooperative  Complications: No apparent anesthesia complications

## 2015-01-20 MED ORDER — MAGNESIUM HYDROXIDE 400 MG/5ML PO SUSP
30.0000 mL | Freq: Once | ORAL | Status: AC
  Administered 2015-01-20: 30 mL via ORAL
  Filled 2015-01-20: qty 30

## 2015-01-20 NOTE — Progress Notes (Signed)
Initial Nutrition Assessment    INTERVENTION:   Meals/Snacks: cater to Krista Drake preferences  NUTRITION DIAGNOSIS:  No Nutrition Diagnosis at this time  GOAL:  Patient will meet greater than or equal to 90% of their needs   MONITOR:   (Energy Intake, Anthropometrics, Electrolyte/Renal Profile, Digestive System)  REASON FOR ASSESSMENT:   (Length of Stay)    ASSESSMENT:  Krista Drake admitted with acute appendicitis s/p appendectomy, possible postop ileus  PMHx:  Past Medical History  Diagnosis Date  . Migraine   . Headache   . Endometriosis     Diet Order:  Regular  Current Nutrition: recorded po intake 50% of meals, Krista Drake reports nausea improved today  Food/Nutrition-Related History: Krista Drake reports poor intake for few days prior to admission, unable to keep anything down. Prior to this, Krista Drake eating well  Medications: NS at 50 ml/hr, dulcolax, phenergan  Electrolyte/Renal Profile and Glucose Profile:   Recent Labs Lab 01/16/15 2242  NA 137  K 3.4*  CL 105  CO2 24  BUN 6  CREATININE 0.65  CALCIUM 8.9  GLUCOSE 114*   Protein Profile:  Recent Labs Lab 01/16/15 2242  ALBUMIN 3.7    Gastrointestinal Profile: +flatus, nausea at times but improved from yesterday, no vomitting Last BM: none documented   Nutrition-Focused Physical Exam Findings: Nutrition-Focused physical exam completed. Findings are WDL for fat depletion, muscle depletion, and edema.     Weight Change: Krista Drake reports stable weight prior to admission Anthropometrics:   Height:  Ht Readings from Last 1 Encounters:  01/17/15 5\' 6"  (1.676 m)    Weight:  Wt Readings from Last 1 Encounters:  01/17/15 218 lb 6.4 oz (99.066 kg)    Ideal Body Weight:     Wt Readings from Last 10 Encounters:  01/17/15 218 lb 6.4 oz (99.066 kg)  01/11/15 215 lb (97.523 kg)  01/10/15 215 lb (97.523 kg)  11/14/14 215 lb (97.523 kg)  06/16/13 187 lb (84.823 kg)    BMI:  Body mass index is 35.27 kg/(m^2).   Diet  Order:  Diet regular Room service appropriate?: Yes; Fluid consistency:: Thin  EDUCATION NEEDS:  No education needs identified at this time  LOW Care Level  Romelle Starcherate Ethal Gotay MS, RD, LDN [phone] Pager

## 2015-01-20 NOTE — Progress Notes (Signed)
3 Days Post-Op   Subjective:  She continues to have lightheadedness mild nausea and intermittent pain and real disinterest in her own recovery. She's not doing much to assist in her management. She is eating and has not vomited.  Vital signs in last 24 hours: Temp:  [97.5 F (36.4 C)-98.4 F (36.9 C)] 97.5 F (36.4 C) (07/08 1620) Pulse Rate:  [65-74] 66 (07/08 1620) Resp:  [14-16] 16 (07/08 1620) BP: (110-122)/(50-62) 113/62 mmHg (07/08 1620) SpO2:  [99 %] 99 % (07/08 1620) Last BM Date: 01/16/15  Intake/Output from previous day: 07/07 0701 - 07/08 0700 In: 1766.5 [P.O.:240; I.V.:1319.5; IV Piggyback:207] Out: 1500 [Urine:1500]  GI: Her abdomen is soft with no significant tenderness. Her wounds look good. She has no sign of any infection. She has active bowel sounds.  Lab Results:  CBC No results for input(s): WBC, HGB, HCT, PLT in the last 72 hours. CMP     Component Value Date/Time   NA 137 01/16/2015 2242   NA 137 10/30/2014 1724   NA 140 06/16/2013 1225   K 3.4* 01/16/2015 2242   K 3.8 10/30/2014 1724   CL 105 01/16/2015 2242   CL 106 10/30/2014 1724   CO2 24 01/16/2015 2242   CO2 23 10/30/2014 1724   GLUCOSE 114* 01/16/2015 2242   GLUCOSE 90 10/30/2014 1724   GLUCOSE 86 06/16/2013 1225   BUN 6 01/16/2015 2242   BUN 8 10/30/2014 1724   BUN 12 06/16/2013 1225   CREATININE 0.65 01/16/2015 2242   CREATININE 0.72 10/30/2014 1724   CALCIUM 8.9 01/16/2015 2242   CALCIUM 9.0 10/30/2014 1724   PROT 7.6 01/16/2015 2242   PROT 8.5* 10/30/2014 1724   PROT 7.9 06/16/2013 1225   ALBUMIN 3.7 01/16/2015 2242   ALBUMIN 3.9 10/30/2014 1724   AST 22 01/16/2015 2242   AST 26 10/30/2014 1724   ALT 23 01/16/2015 2242   ALT 19 10/30/2014 1724   ALKPHOS 110 01/16/2015 2242   ALKPHOS 140* 10/30/2014 1724   BILITOT 0.4 01/16/2015 2242   GFRNONAA >60 01/16/2015 2242   GFRNONAA >60 10/30/2014 1724   GFRAA >60 01/16/2015 2242   GFRAA >60 10/30/2014 1724   PT/INR No results  for input(s): LABPROT, INR in the last 72 hours.  Studies/Results: No results found.  Assessment/Plan: We will continue to assist her as necessary. We'll discontinue her IV and give her some more cathartics to help with her bowel function. I'm trying stimulate some interest in ambulating and looking after cells but have met significant resistance. We hope to be able to discharge her next 24 hours.

## 2015-01-21 MED ORDER — OXYCODONE-ACETAMINOPHEN 5-325 MG PO TABS: 1.0000 | ORAL_TABLET | Freq: Four times a day (QID) | ORAL | Status: DC | PRN

## 2015-01-21 NOTE — Discharge Instructions (Signed)
Pyelonephritis, Adult Pyelonephritis is a kidney infection. In general, there are 2 main types of pyelonephritis:  Infections that come on quickly without any warning (acute pyelonephritis).  Infections that persist for a long period of time (chronic pyelonephritis). CAUSES  Two main causes of pyelonephritis are:  Bacteria traveling from the bladder to the kidney. This is a problem especially in pregnant women. The urine in the bladder can become filled with bacteria from multiple causes, including:  Inflammation of the prostate gland (prostatitis).  Sexual intercourse in females.  Bladder infection (cystitis).  Bacteria traveling from the bloodstream to the tissue part of the kidney. Problems that may increase your risk of getting a kidney infection include:  Diabetes.  Kidney stones or bladder stones.  Cancer.  Catheters placed in the bladder.  Other abnormalities of the kidney or ureter. SYMPTOMS   Abdominal pain.  Pain in the side or flank area.  Fever.  Chills.  Upset stomach.  Blood in the urine (dark urine).  Frequent urination.  Strong or persistent urge to urinate.  Burning or stinging when urinating. DIAGNOSIS  Your caregiver may diagnose your kidney infection based on your symptoms. A urine sample may also be taken. TREATMENT  In general, treatment depends on how severe the infection is.   If the infection is mild and caught early, your caregiver may treat you with oral antibiotics and send you home.  If the infection is more severe, the bacteria may have gotten into the bloodstream. This will require intravenous (IV) antibiotics and a hospital stay. Symptoms may include:  High fever.  Severe flank pain.  Shaking chills.  Even after a hospital stay, your caregiver may require you to be on oral antibiotics for a period of time.  Other treatments may be required depending upon the cause of the infection. HOME CARE INSTRUCTIONS   Take your  antibiotics as directed. Finish them even if you start to feel better.  Make an appointment to have your urine checked to make sure the infection is gone.  Drink enough fluids to keep your urine clear or pale yellow.  Take medicines for the bladder if you have urgency and frequency of urination as directed by your caregiver. SEEK IMMEDIATE MEDICAL CARE IF:   You have a fever or persistent symptoms for more than 2-3 days.  You have a fever and your symptoms suddenly get worse.  You are unable to take your antibiotics or fluids.  You develop shaking chills.  You experience extreme weakness or fainting.  There is no improvement after 2 days of treatment. MAKE SURE YOU:  Understand these instructions.  Will watch your condition.  Will get help right away if you are not doing well or get worse. Document Released: 07/01/2005 Document Revised: 12/31/2011 Document Reviewed: 12/05/2010 [facility] Patient Information 2015 [place], [place]. This information is not intended to replace advice given to you by your health care provider. Make sure you discuss any questions you have with your health care provider. Laparoscopic Appendectomy  Care After    These instructions give you information on caring for yourself after your procedure. Your doctor may also give you more specific instructions. Call your doctor if you have any problems or questions after your procedure.  HOME CARE  Do not drive while taking pain medicine (narcotics).  Take medicine (stool softener) if you cannot poop (constipated).  Change your bandages (dressings) as told by your doctor.  Keep your wounds clean and dry. Wash the wounds gently with soap and  water. Gently pat the wounds dry with a clean towel.  Do not take baths, swim, or use hot tubs for 10 days, or as told by your doctor.  Only take medicine as told by your doctor.  Continue your normal diet as told by your doctor.  Do not lift more than 10 pounds (4.5  kilograms) or play contact sports for 3 weeks, or as told by your doctor.  Slowly increase your activity.  Take deep breaths to avoid a lung infection (pneumonia). GET HELP RIGHT AWAY IF:  You have a fever >101 You have a rash.  You have trouble breathing or sharp chest pain.  You have a reaction to the medicine you are taking.  Your wound is red, puffy (swollen), or painful.  You have yellowish-white fluid (pus) coming from the wound.  You have fluid coming from the wound for longer than 1 day.  You notice a bad smell coming from the wound or bandage.  Your wound breaks open after stitches (sutures) or staples are removed.  You have pain in the shoulders or shoulder blades.   You are short of breath.  You feel sick to your stomach (nauseous) or throw up (vomit).  MAKE SURE YOU:  Understand these instructions.  Will watch your condition.  Will get help right away if you are not doing well or get worse.

## 2015-01-21 NOTE — Discharge Summary (Signed)
Patient ID: Krista Drake MRN: [id number] DOB/AGE: 22/08/1990 22 y.o.  Admit date: 01/16/2015 Discharge date: 01/21/2015  Discharge Diagnoses:  Acute appendicitis  Procedures Performed: Upper scab appendectomy  Discharged Condition: fair  Hospital Course: She was admitted through the emergency room with imaging and clinical presentation consistent with acute appendicitis. She had some urinary tract symptoms and treated for possible UTI pyelonephritis. After appropriate preoperative preparation informed consent she was taken to surgery where she underwent a laparoscopic appendectomy. The procedure was uncomplicated. She had no significant intraoperative problems. Postoperatively she had some mild nausea and pain control issues which persisted over several days. Following that able to get her up active to the bathroom on her own tolerating regular diet. We'll discharge her home today for follow-up in the office in 7-10 days time. Bathing activity and driving instructions were given the patient. Discharge Orders: Discharge Instructions    Call MD for:  persistant nausea and vomiting    Complete by:  As directed      Call MD for:  redness, tenderness, or signs of infection (pain, swelling, redness, odor or green/yellow discharge around incision site)    Complete by:  As directed      Call MD for:  severe uncontrolled pain    Complete by:  As directed      Diet - low sodium heart healthy    Complete by:  As directed      Driving Restrictions    Complete by:  As directed   No driving while taking pain medicine     Increase activity slowly    Complete by:  As directed      Lifting restrictions    Complete by:  As directed   Do not lift anything heavier than ear dinner plate     No dressing needed    Complete by:  As directed            Disposition: 01-Home or Self Care  Discharge Medications:  Current facility-administered medications:  .  acetaminophen (TYLENOL) tablet 650 mg,  650 mg, Oral, Q6H PRN **OR** acetaminophen (TYLENOL) suppository 650 mg, 650 mg, Rectal, Q6H PRN, Ida Roguehristopher Lundquist, MD .  bisacodyl (DULCOLAX) EC tablet 5 mg, 5 mg, Oral, Daily PRN, Tiney Rougealph Ely III, MD .  famotidine (PEPCID) tablet 20 mg, 20 mg, Oral, Daily, Tiney Rougealph Ely III, MD, 20 mg at 01/20/15 1013 .  heparin injection 5,000 Units, 5,000 Units, Subcutaneous, 3 times per day, Tiney Rougealph Ely III, MD, 5,000 Units at 01/21/15 0501 .  HYDROmorphone (DILAUDID) injection 1 mg, 1 mg, Intravenous, Q2H PRN, Tiney Rougealph Ely III, MD, 1 mg at 01/20/15 1025 .  norethindrone (AYGESTIN) tablet 5 mg, 5 mg, Oral, Daily, Tiney Rougealph Ely III, MD, 5 mg at 01/20/15 1013 .  oxyCODONE-acetaminophen (PERCOCET/ROXICET) 5-325 MG per tablet 1-2 tablet, 1-2 tablet, Oral, Q6H PRN, Tiney Rougealph Ely III, MD, 2 tablet at 01/21/15 0501 .  promethazine (PHENERGAN) injection 12.5 mg, 12.5 mg, Intravenous, Q4H PRN, Tiney Rougealph Ely III, MD, 12.5 mg at 01/20/15 1636  Follwup: Follow-up Information    Follow up with [facility] In 1 week.   Why:  For suture removal, For wound re-check   Contact information:   [address], [address] [place] [id number] [phone]      Signed: Tiney RougeRalph Ely III 01/21/2015, 9:48 AM  2

## 2015-01-21 NOTE — Progress Notes (Signed)
Pt alert and oriented. Discharged to home. IV removed. Concerns addressed. Prescriptions and discharge summary given to patient.

## 2015-01-30 ENCOUNTER — Encounter: Payer: Self-pay | Admitting: Surgery

## 2015-01-30 ENCOUNTER — Ambulatory Visit (INDEPENDENT_AMBULATORY_CARE_PROVIDER_SITE_OTHER): Payer: Federal, State, Local not specified - PPO | Admitting: Surgery

## 2015-01-30 VITALS — BP 125/85 | HR 76 | Temp 98.2°F | Ht 67.0 in | Wt 232.0 lb

## 2015-01-30 DIAGNOSIS — K353 Acute appendicitis with localized peritonitis, without perforation or gangrene: Secondary | ICD-10-CM

## 2015-01-30 NOTE — Progress Notes (Signed)
Subjective:     Patient ID: Krista Drake, female   DOB: 04/25/1991, 24 y.o.   MRN: [id number]  HPI 24 year-old female underwent laparoscopic appendectomy for acute appendicitis in early July. Her main complaint today is her lower abdominal incision causing her some discomfort. The patient is eating she's had no fever no GI symptoms.  Review of Systems  Constitutional: Negative.   Eyes: Negative.   Gastrointestinal: Positive for abdominal pain.  Genitourinary: Negative.   All other systems reviewed and are negative.      Objective:   Physical Exam  Constitutional: She appears well-developed and well-nourished. No distress.  HENT:  Head: Normocephalic and atraumatic.  Eyes: Pupils are equal, round, and reactive to light.  Abdominal: She exhibits no distension. There is no rebound.  There is mild tenderness around suprapubic incision.       Assessment:     24 year old female status post left scopic appendectomy with residual lower abdominal incisional pain.    Plan:        Overall she is doing well. She was encouraged for usage of Tylenol and heating pad for her lower abdominal incision and we will see her back in the office as needed.

## 2015-03-01 ENCOUNTER — Telehealth: Payer: Self-pay

## 2015-03-01 NOTE — Telephone Encounter (Signed)
Call made to patient at this time to see how she was feeling and if she still feels like she needs to keep this appointment with Dr. Michela Pitcher as we were going to follow-up with her PRN.  Called all numbers listed. No answer at this time. Left voicemail on [phone] for return phone call. Unable to leave message on any other number listed.  Will await return phone call or call once again at a later time.

## 2015-03-02 ENCOUNTER — Ambulatory Visit: Payer: Federal, State, Local not specified - PPO | Admitting: Surgery

## 2015-03-02 ENCOUNTER — Telehealth: Payer: Self-pay

## 2015-03-02 NOTE — Telephone Encounter (Signed)
Called patient to ask if she still needs her appointment and was not able to leave her a voicemail. I will try to contact her again.

## 2015-03-03 ENCOUNTER — Ambulatory Visit: Payer: Federal, State, Local not specified - PPO | Admitting: Surgery

## 2015-03-03 NOTE — Telephone Encounter (Signed)
Called patient again and wasn't able to get in touch with her. However, Amber called patient's pharmacy and then she called patient and was told not to come to her visit. Patient agreed.

## 2015-03-28 ENCOUNTER — Emergency Department
Admission: EM | Admit: 2015-03-28 | Discharge: 2015-03-29 | Disposition: A | Discharge status: Home / Self Care | Payer: Federal, State, Local not specified - PPO | Source: Home / Self Care | Attending: Emergency Medicine | Admitting: Emergency Medicine

## 2015-03-28 DIAGNOSIS — J039 Acute tonsillitis, unspecified: Secondary | ICD-10-CM

## 2015-03-28 MED ORDER — DEXTROSE 5 % IV SOLN
1.0000 g | INTRAVENOUS | Status: DC
  Administered 2015-03-29: 1 g via INTRAVENOUS
  Filled 2015-03-28: qty 10

## 2015-03-28 MED ORDER — DEXAMETHASONE SODIUM PHOSPHATE 10 MG/ML IJ SOLN
INTRAMUSCULAR | Status: AC
  Administered 2015-03-29: 10 mg via INTRAVENOUS
  Filled 2015-03-28: qty 1

## 2015-03-28 NOTE — ED Provider Notes (Signed)
[facility] Emergency Department Provider Note  ____________________________________________  Time seen: 11:20 PM  I have reviewed the triage vital signs and the nursing notes.   HISTORY  Chief Complaint Oral Swelling     HPI Krista Drake is a 24 y.o. female presents with three-day history of sore throat fever Tmax 103. Patient states today difficulty speaking with muffled voice. Patient states unable to eat or drink secondary to pain.     Past Medical History  Diagnosis Date  . Migraine   . Headache   . Endometriosis     Patient Active Problem List   Diagnosis Date Noted  . Appendicitis, acute 01/17/2015  . Abdominal pain, unspecified site 06/18/2013  . Hx of migraine headaches 06/18/2013    Past Surgical History  Procedure Laterality Date  . Wisdom tooth extraction    . Laparoscopic appendectomy N/A 01/17/2015    Procedure: APPENDECTOMY LAPAROSCOPIC;  Surgeon: Tiney Rouge III, MD;  Location: [facility] ORS;  Service: General;  Laterality: N/A;    Current Outpatient Rx  Name  Route  Sig  Dispense  Refill  . oxyCODONE-acetaminophen (PERCOCET/ROXICET) 5-325 MG per tablet   Oral   Take 1-2 tablets by mouth every 6 (six) hours as needed for moderate pain.   30 tablet   0     Allergies Zoloft  Family History  Problem Relation Age of Onset  . Diabetes Maternal Grandmother   . Diabetes Maternal Grandfather   . Cancer Paternal Grandmother     breast  . Anemia Mother     Social History Social History  Substance Use Topics  . Smoking status: Never Smoker   . Smokeless tobacco: Never Used  . Alcohol Use: No    Review of Systems  Constitutional: Positive for fever. Eyes: Negative for visual changes. ENT: Positive for sore throat Cardiovascular: Negative for chest pain. Respiratory: Negative for shortness of breath. Gastrointestinal: Negative for abdominal pain, vomiting and diarrhea. Genitourinary: Negative for  dysuria. Musculoskeletal: Negative for back pain. Skin: Negative for rash. Neurological: Negative for headaches, focal weakness or numbness.   10-point ROS otherwise negative.  ____________________________________________   PHYSICAL EXAM:  VITAL SIGNS: ED Triage Vitals  Enc Vitals Group     BP 03/28/15 2302 131/90 mmHg     Pulse Rate 03/28/15 2302 162     Resp 03/28/15 2302 18     Temp 03/28/15 2302 103.2 F (39.6 C)     Temp Source 03/28/15 2302 Oral     SpO2 03/28/15 2302 97 %     Weight --      Height --      Head Cir --      Peak Flow --      Pain Score 03/28/15 2303 10     Pain Loc --      Pain Edu? --      Excl. in GC? --      Constitutional: Alert and oriented. Well appearing and in no distress. Eyes: Conjunctivae are normal. PERRL. Normal extraocular movements. ENT   Head: Normocephalic and atraumatic.   Nose: No congestion/rhinnorhea.   Mouth/Throat: Pharyngeal erythema with exudate. Tonsillitis right greater than left with uvula deviation to the left.   Neck: No stridor. Hematological/Lymphatic/Immunilogical: No cervical lymphadenopathy. Cardiovascular: Normal rate, regular rhythm. Normal and symmetric distal pulses are present in all extremities. No murmurs, rubs, or gallops. Respiratory: Normal respiratory effort without tachypnea nor retractions. Breath sounds are clear and equal bilaterally. No wheezes/rales/rhonchi. Gastrointestinal: Soft and nontender.  No distention. There is no CVA tenderness. Genitourinary: deferred Musculoskeletal: Nontender with normal range of motion in all extremities. No joint effusions.  No lower extremity tenderness nor edema. Neurologic:  Normal speech and language. No gross focal neurologic deficits are appreciated. Speech is normal.  Skin:  Skin is warm, dry and intact. No rash noted. Psychiatric: Mood and affect are normal. Speech and behavior are normal. Patient exhibits appropriate insight and  judgment.  ____________________________________________    LABS (pertinent positives/negatives)  Labs Reviewed  CBC - Abnormal; Notable for the following:    WBC 16.9 (*)    RDW 15.2 (*)    All other components within normal limits  COMPREHENSIVE METABOLIC PANEL - Abnormal; Notable for the following:    Potassium 3.3 (*)    Total Protein 8.7 (*)    Alkaline Phosphatase 133 (*)    All other components within normal limits  CULTURE, BLOOD (ROUTINE X 2)  CULTURE, BLOOD (ROUTINE X 2)  LACTIC ACID, PLASMA  LACTIC ACID, PLASMA     RADIOLOGY  CT Soft Tissue Neck W Contrast (Final result) Result time: 03/29/15 01:53:33   Final result by Rad Results In Interface (03/29/15 01:53:33)   Narrative:   CLINICAL DATA: Tonsillitis, right greater than left with uvula deviation. Throat swelling.  EXAM: CT NECK WITH CONTRAST  TECHNIQUE: Multidetector CT imaging of the neck was performed using the standard protocol following the bolus administration of intravenous contrast.  CONTRAST: 75mL OMNIPAQUE IOHEXOL 300 MG/ML SOLN  COMPARISON: None.  FINDINGS: Pharynx and larynx: Tonsillar hypertrophy with tonsillar edema, right greater than left. No tonsillar or peritonsillar abscess. Enlarged adenoidal soft tissue. The epiglottis is normal. No retropharyngeal fluid collection.  Salivary glands: Symmetric without surrounding inflammation or ductal dilatation.  Thyroid: Normal.  Lymph nodes: Prominent and enlarged lymph nodes in both cervical chains, likely reactive.  Vascular: Jugular veins and carotid arteries are patent.  Limited intracranial: Normal.  Visualized orbits: Normal.  Mastoids and visualized paranasal sinuses: Well aerated. No mucosal thickening, opacification or fluid level.  Skeleton: No abnormality.  Upper chest: Upper lungs are clear. No upper mediastinal adenopathy.  IMPRESSION: 1. Edematous tonsils with hypertrophy consistent with tonsillitis. No  tonsillar or peritonsillar abscess. Prominent adenoidal soft tissue. 2. Cervical adenopathy is likely reactive.   Electronically Signed By: Rubye Oaks M.D. On: 03/29/2015 01:53          INITIAL IMPRESSION / ASSESSMENT AND PLAN / ED COURSE  Pertinent labs & imaging results that were available during my care of the patient were reviewed by me and considered in my medical decision making (see chart for details).  Patient received ceftriaxone 1 g IV as well as Decadron 10 mg. We'll prescribe Augmentin and prednisone for the patient at home. Patient advised to follow-up with Dr. Scot Jun 24 hours for reevaluation  ____________________________________________   FINAL CLINICAL IMPRESSION(S) / ED DIAGNOSES  Final diagnoses:  Tonsillitis      Darci Current, MD 03/29/15 [phone]

## 2015-03-28 NOTE — ED Notes (Signed)
Patient presents with swelling to her throat - HPI limited due to patient's inability to speak. Patient with obvious swelling noted. Unable to eat and drink. (+) generalized headache and LEFT ear pain reported.

## 2015-03-29 ENCOUNTER — Emergency Department: Payer: Federal, State, Local not specified - PPO

## 2015-03-29 DIAGNOSIS — J039 Acute tonsillitis, unspecified: Secondary | ICD-10-CM | POA: Diagnosis not present

## 2015-03-29 LAB — CBC
HEMATOCRIT: 41.5 % (ref 35.0–47.0)
HEMOGLOBIN: 13.7 g/dL (ref 12.0–16.0)
MCH: 27.8 pg (ref 26.0–34.0)
MCHC: 33.1 g/dL (ref 32.0–36.0)
MCV: 83.8 fL (ref 80.0–100.0)
Platelets: 178 10*3/uL (ref 150–440)
RBC: 4.95 MIL/uL (ref 3.80–5.20)
RDW: 15.2 % — ABNORMAL HIGH (ref 11.5–14.5)
WBC: 16.9 10*3/uL — ABNORMAL HIGH (ref 3.6–11.0)

## 2015-03-29 LAB — COMPREHENSIVE METABOLIC PANEL
ALK PHOS: 133 U/L — AB (ref 38–126)
ALT: 16 U/L (ref 14–54)
ANION GAP: 10 (ref 5–15)
AST: 18 U/L (ref 15–41)
Albumin: 4 g/dL (ref 3.5–5.0)
BILIRUBIN TOTAL: 0.8 mg/dL (ref 0.3–1.2)
BUN: 6 mg/dL (ref 6–20)
CALCIUM: 8.9 mg/dL (ref 8.9–10.3)
CO2: 23 mmol/L (ref 22–32)
Chloride: 104 mmol/L (ref 101–111)
Creatinine, Ser: 0.89 mg/dL (ref 0.44–1.00)
GLUCOSE: 92 mg/dL (ref 65–99)
POTASSIUM: 3.3 mmol/L — AB (ref 3.5–5.1)
Sodium: 137 mmol/L (ref 135–145)
TOTAL PROTEIN: 8.7 g/dL — AB (ref 6.5–8.1)

## 2015-03-29 LAB — POCT PREGNANCY, URINE: PREG TEST UR: NEGATIVE

## 2015-03-29 LAB — LACTIC ACID, PLASMA: Lactic Acid, Venous: 1.2 mmol/L (ref 0.5–2.0)

## 2015-03-29 MED ORDER — AMOXICILLIN-POT CLAVULANATE 875-125 MG PO TABS: 1.0000 | ORAL_TABLET | Freq: Two times a day (BID) | ORAL | Status: DC

## 2015-03-29 MED ORDER — ACETAMINOPHEN 160 MG/5ML PO SOLN
650.0000 mg | Freq: Once | ORAL | Status: AC
  Administered 2015-03-29: 650 mg via ORAL
  Filled 2015-03-29: qty 20.3

## 2015-03-29 MED ORDER — SODIUM CHLORIDE 0.9 % IV BOLUS (SEPSIS)
1000.0000 mL | Freq: Once | INTRAVENOUS | Status: AC
  Administered 2015-03-29: 1000 mL via INTRAVENOUS

## 2015-03-29 MED ORDER — DEXAMETHASONE SODIUM PHOSPHATE 10 MG/ML IJ SOLN
10.0000 mg | Freq: Once | INTRAMUSCULAR | Status: AC
  Administered 2015-03-29: 10 mg via INTRAVENOUS

## 2015-03-29 MED ORDER — IOHEXOL 300 MG/ML  SOLN
75.0000 mL | Freq: Once | INTRAMUSCULAR | Status: AC | PRN
  Administered 2015-03-29: 75 mL via INTRAVENOUS

## 2015-03-29 MED ORDER — PREDNISONE 20 MG PO TABS: 60.0000 mg | ORAL_TABLET | Freq: Every day | ORAL | Status: DC

## 2015-03-29 NOTE — Discharge Instructions (Signed)

## 2015-03-29 NOTE — ED Notes (Signed)
POCT urine Negative  

## 2015-03-30 ENCOUNTER — Inpatient Hospital Stay
Admission: EM | Admit: 2015-03-30 | Discharge: 2015-04-01 | DRG: 153 | Disposition: A | Discharge status: Home / Self Care | Payer: Federal, State, Local not specified - PPO | Attending: Internal Medicine | Admitting: Internal Medicine

## 2015-03-30 DIAGNOSIS — N809 Endometriosis, unspecified: Secondary | ICD-10-CM | POA: Diagnosis present

## 2015-03-30 DIAGNOSIS — G43909 Migraine, unspecified, not intractable, without status migrainosus: Secondary | ICD-10-CM | POA: Diagnosis present

## 2015-03-30 DIAGNOSIS — D72829 Elevated white blood cell count, unspecified: Secondary | ICD-10-CM

## 2015-03-30 DIAGNOSIS — R1310 Dysphagia, unspecified: Secondary | ICD-10-CM | POA: Diagnosis present

## 2015-03-30 DIAGNOSIS — J039 Acute tonsillitis, unspecified: Principal | ICD-10-CM | POA: Diagnosis present

## 2015-03-30 DIAGNOSIS — Z888 Allergy status to other drugs, medicaments and biological substances status: Secondary | ICD-10-CM | POA: Diagnosis not present

## 2015-03-30 DIAGNOSIS — J351 Hypertrophy of tonsils: Secondary | ICD-10-CM | POA: Diagnosis present

## 2015-03-30 DIAGNOSIS — R590 Localized enlarged lymph nodes: Secondary | ICD-10-CM

## 2015-03-30 LAB — CBC WITH DIFFERENTIAL/PLATELET
BASOS ABS: 0.4 10*3/uL — AB (ref 0–0.1)
Basophils Relative: 1 %
EOS ABS: 0.1 10*3/uL (ref 0–0.7)
EOS PCT: 0 %
HCT: 42.4 % (ref 35.0–47.0)
HEMOGLOBIN: 13.8 g/dL (ref 12.0–16.0)
LYMPHS ABS: 0.9 10*3/uL — AB (ref 1.0–3.6)
Lymphocytes Relative: 4 %
MCH: 27.2 pg (ref 26.0–34.0)
MCHC: 32.5 g/dL (ref 32.0–36.0)
MCV: 83.9 fL (ref 80.0–100.0)
Monocytes Absolute: 1 10*3/uL — ABNORMAL HIGH (ref 0.2–0.9)
Monocytes Relative: 4 %
NEUTROS PCT: 91 %
Neutro Abs: 22.7 10*3/uL — ABNORMAL HIGH (ref 1.4–6.5)
PLATELETS: 214 10*3/uL (ref 150–440)
RBC: 5.06 MIL/uL (ref 3.80–5.20)
RDW: 15.3 % — ABNORMAL HIGH (ref 11.5–14.5)
WBC: 25 10*3/uL — AB (ref 3.6–11.0)

## 2015-03-30 LAB — COMPREHENSIVE METABOLIC PANEL
ALBUMIN: 3.8 g/dL (ref 3.5–5.0)
ALK PHOS: 119 U/L (ref 38–126)
ALT: 17 U/L (ref 14–54)
AST: 26 U/L (ref 15–41)
Anion gap: 8 (ref 5–15)
BUN: 10 mg/dL (ref 6–20)
CALCIUM: 9.5 mg/dL (ref 8.9–10.3)
CHLORIDE: 108 mmol/L (ref 101–111)
CO2: 22 mmol/L (ref 22–32)
CREATININE: 0.74 mg/dL (ref 0.44–1.00)
GFR calc Af Amer: >60 mL/min (ref >60)
GFR calc non Af Amer: >60 mL/min (ref >60)
GLUCOSE: 94 mg/dL (ref 65–99)
Potassium: 3.5 mmol/L (ref 3.5–5.1)
SODIUM: 138 mmol/L (ref 135–145)
Total Bilirubin: 0.6 mg/dL (ref 0.3–1.2)
Total Protein: 8.6 g/dL — ABNORMAL HIGH (ref 6.5–8.1)

## 2015-03-30 LAB — POCT RAPID STREP A: Streptococcus, Group A Screen (Direct): NEGATIVE

## 2015-03-30 MED ORDER — KETOROLAC TROMETHAMINE 30 MG/ML IJ SOLN
INTRAMUSCULAR | Status: AC
  Administered 2015-03-30: 15 mg
  Filled 2015-03-30: qty 1

## 2015-03-30 MED ORDER — MORPHINE SULFATE (PF) 2 MG/ML IV SOLN
2.0000 mg | INTRAVENOUS | Status: DC | PRN
  Administered 2015-03-30 – 2015-04-01 (×7): 2 mg via INTRAVENOUS
  Filled 2015-03-30 (×8): qty 1

## 2015-03-30 MED ORDER — ACETAMINOPHEN 325 MG PO TABS
650.0000 mg | ORAL_TABLET | Freq: Four times a day (QID) | ORAL | Status: DC | PRN
Start: 2015-03-30 — End: 2015-04-01
  Administered 2015-03-30: 650 mg via ORAL
  Filled 2015-03-30: qty 2

## 2015-03-30 MED ORDER — DEXAMETHASONE SODIUM PHOSPHATE 4 MG/ML IJ SOLN
4.0000 mg | Freq: Four times a day (QID) | INTRAMUSCULAR | Status: DC
  Administered 2015-03-30: 4 mg via INTRAVENOUS
  Filled 2015-03-30 (×3): qty 1

## 2015-03-30 MED ORDER — SODIUM CHLORIDE 0.9 % IV SOLN
INTRAVENOUS | Status: AC
Start: 2015-03-30 — End: 2015-03-31
  Administered 2015-03-30 – 2015-03-31 (×2): via INTRAVENOUS

## 2015-03-30 MED ORDER — SODIUM CHLORIDE 0.9 % IV BOLUS (SEPSIS)
1000.0000 mL | Freq: Once | INTRAVENOUS | Status: AC
Start: 2015-03-30 — End: 2015-03-30
  Administered 2015-03-30: 1000 mL via INTRAVENOUS

## 2015-03-30 MED ORDER — MORPHINE SULFATE (PF) 4 MG/ML IV SOLN
4.0000 mg | Freq: Once | INTRAVENOUS | Status: AC
  Administered 2015-03-30: 4 mg via INTRAVENOUS
  Filled 2015-03-30: qty 1

## 2015-03-30 MED ORDER — SODIUM CHLORIDE 0.9 % IV SOLN
3.0000 g | Freq: Four times a day (QID) | INTRAVENOUS | Status: DC
  Administered 2015-03-30 – 2015-04-01 (×7): 3 g via INTRAVENOUS
  Filled 2015-03-30 (×11): qty 3

## 2015-03-30 MED ORDER — ONDANSETRON HCL 4 MG PO TABS: 4.0000 mg | ORAL_TABLET | Freq: Four times a day (QID) | ORAL | Status: DC | PRN

## 2015-03-30 MED ORDER — KETOROLAC TROMETHAMINE 15 MG/ML IJ SOLN
15.0000 mg | INTRAMUSCULAR | Status: DC | PRN
  Administered 2015-03-30 – 2015-04-01 (×5): 15 mg via INTRAVENOUS
  Filled 2015-03-30 (×6): qty 1

## 2015-03-30 MED ORDER — DEXAMETHASONE SODIUM PHOSPHATE 10 MG/ML IJ SOLN
10.0000 mg | Freq: Three times a day (TID) | INTRAMUSCULAR | Status: DC
  Administered 2015-03-30: 6 mg via INTRAVENOUS
  Administered 2015-03-31 – 2015-04-01 (×5): 10 mg via INTRAVENOUS
  Filled 2015-03-30 (×7): qty 1

## 2015-03-30 MED ORDER — ONDANSETRON HCL 4 MG/2ML IJ SOLN
4.0000 mg | Freq: Once | INTRAMUSCULAR | Status: AC
  Administered 2015-03-30: 4 mg via INTRAVENOUS
  Filled 2015-03-30: qty 2

## 2015-03-30 MED ORDER — ONDANSETRON HCL 4 MG/2ML IJ SOLN: 4.0000 mg | Freq: Four times a day (QID) | INTRAMUSCULAR | Status: DC | PRN

## 2015-03-30 MED ORDER — DEXTROSE 5 % IV SOLN
1.0000 g | Freq: Once | INTRAVENOUS | Status: AC
  Administered 2015-03-30: 1 g via INTRAVENOUS
  Filled 2015-03-30: qty 10

## 2015-03-30 MED ORDER — METHYLPREDNISOLONE SODIUM SUCC 125 MG IJ SOLR
125.0000 mg | Freq: Once | INTRAMUSCULAR | Status: AC
  Administered 2015-03-30: 125 mg via INTRAVENOUS
  Filled 2015-03-30: qty 2

## 2015-03-30 MED ORDER — AMPICILLIN-SULBACTAM SODIUM 1.5 (1-0.5) G IJ SOLR
1.5000 g | Freq: Once | INTRAMUSCULAR | Status: AC
  Administered 2015-03-30: 1.5 g via INTRAVENOUS
  Filled 2015-03-30 (×2): qty 1.5

## 2015-03-30 MED ORDER — SODIUM CHLORIDE 0.9 % IV SOLN
1.5000 g | Freq: Four times a day (QID) | INTRAVENOUS | Status: DC
  Filled 2015-03-30 (×3): qty 1.5

## 2015-03-30 MED ORDER — SODIUM CHLORIDE 0.9 % IJ SOLN: 3.0000 mL | Freq: Two times a day (BID) | INTRAMUSCULAR | Status: DC

## 2015-03-30 MED ORDER — ACETAMINOPHEN 650 MG RE SUPP: 650.0000 mg | Freq: Four times a day (QID) | RECTAL | Status: DC | PRN

## 2015-03-30 MED ORDER — SODIUM CHLORIDE 0.9 % IV SOLN
3.0000 g | Freq: Two times a day (BID) | INTRAVENOUS | Status: DC
  Filled 2015-03-30 (×2): qty 3

## 2015-03-30 NOTE — Consult Note (Signed)
Krista Drake, Krista Drake [id number] 12-15-1990 Ramonita Lab, MD  Reason for Consult: Tonsillitis  HPI: 24 year old female admitted from the emergency room with a history of sore throat. She was seen in the emergency room approximately 2 days prior to admission where she was diagnosed with severe tonsillitis. Laboratory data CT scans time showed a peritonsillar abscess but just tonsillitis she also had an elevated white count. She was given oral medications and IM injection of Rocephin. Initially felt better but over the last 24 hours has gotten worse present back to the emergency room for reevaluation. Hospital history is noted and listed in the chart as her medications view of systems significant only for severe sore throat mom does tell me that she has a history of tonsillitis in the past and has discussed possibly having her tonsils removed.  Allergies:  Allergies  Allergen Reactions  . Caramel Anaphylaxis  . Zoloft [Sertraline Hcl] Hives    ROS: Review of systems normal other than 12 systems except per HPI.  PMH:  Past Medical History  Diagnosis Date  . Migraine   . Headache   . Endometriosis     FH:  Family History  Problem Relation Age of Onset  . Diabetes Maternal Grandmother   . Diabetes Maternal Grandfather   . Cancer Paternal Grandmother     breast  . Anemia Mother     SH:  Social History   Social History  . Marital Status: Single    Spouse Name: N/A  . Number of Children: N/A  . Years of Education: N/A   Occupational History  . Not on file.   Social History Main Topics  . Smoking status: Never Smoker   . Smokeless tobacco: Never Used  . Alcohol Use: No  . Drug Use: No  . Sexual Activity: Not on file   Other Topics Concern  . Not on file   Social History Narrative    PSH:  Past Surgical History  Procedure Laterality Date  . Wisdom tooth extraction    . Laparoscopic appendectomy N/A 01/17/2015    Procedure: APPENDECTOMY LAPAROSCOPIC;  Surgeon: Tiney Rouge  III, MD;  Location: [facility] ORS;  Service: General;  Laterality: N/A;    Physical  Exam: Healthy-appearing 24 year old female. Laying in bed whispering to talk. CN 2-12 grossly intact and symmetric. EAC/TMs normal BL. Oral cavity, lips, gums, normal. Oral cavity shows 3+ tonsils with exudates bilaterally. Palpation of neck showed diffuse adenopathy on both sides. Skin warm and dry. Nasal cavity without polyps or purulence. External nose and ears without masses or lesions. EOMI, PERRLA. Neck supple with no masses or lesions. Thyroid normal with no masses.   A/P: Acute tonsillitis appears to be Epstein-Barr virus related i.e. mono. Would recommend admission and hospital under hospitalist program with IV Unasyn 3 g IV every 6 hours and Decadron 10 mg IV every 8 hours. We'll continue this until patient is able to take liquids by mouth. Could then discharged home on oral medications suggest Augmentin 875 by mouth twice a day for 10 days and a 12 day double strength Sterapred taper. Would also recommend EBV titers to rule out mono as her Monospot was negative. I do not think she needs another CT scan as there is no evidence of abscess. I have talked to her mom about scheduling a follow-up appointment with me as an outpatient in 10 days we will discuss tonsillectomy at that time.   Dezi Brauner T 03/30/2015 6:25 PM

## 2015-03-30 NOTE — H&P (Signed)
[facility] [place] at [facility]   PATIENT NAME: Krista Drake    MR#:  [id number]  DATE OF BIRTH:  05-16-1991  DATE OF ADMISSION:  03/30/2015  PRIMARY CARE PHYSICIAN: No PCP Per Patient   REQUESTING/REFERRING PHYSICIAN:   CHIEF COMPLAINT:  Sore throat with difficulty swallowing  HISTORY OF PRESENT ILLNESS:  Krista Drake  is a 24 y.o. female with a known history of migraine headaches and endometriosis is presenting to the ED with a chief complaint of sore throat, fever with difficulty swallowing and speaking. Patient was seen by the ED physician yesterday for similar complaints and was the given antibiotics and discharged home. Patient came back to the ED today with worsening of symptoms including difficulty with swallowing and speaking. At one point she also was short of breath. He did physician has discussed this with on-call ENT physician who has recommended to admit the patient and give IV Unasyn and Decadron. During my examination patient is resting comfortably but having hard time to speak, has raspy voice. Mom is at bedside  PAST MEDICAL HISTORY:   Past Medical History  Diagnosis Date  . Migraine   . Headache   . Endometriosis     PAST SURGICAL HISTOIRY:   Past Surgical History  Procedure Laterality Date  . Wisdom tooth extraction    . Laparoscopic appendectomy N/A 01/17/2015    Procedure: APPENDECTOMY LAPAROSCOPIC;  Surgeon: Tiney Rouge III, MD;  Location: [facility] ORS;  Service: General;  Laterality: N/A;    SOCIAL HISTORY:   Social History  Substance Use Topics  . Smoking status: Never Smoker   . Smokeless tobacco: Never Used  . Alcohol Use: No   denies any illicit drug usage  FAMILY HISTORY:   Family History  Problem Relation Age of Onset  . Diabetes Maternal Grandmother   . Diabetes Maternal Grandfather   . Cancer Paternal Grandmother     breast  . Anemia Mother     DRUG ALLERGIES:   Allergies  Allergen Reactions  .  Caramel Anaphylaxis  . Zoloft [Sertraline Hcl] Hives    REVIEW OF SYSTEMS:  CONSTITUTIONAL: Reporting fever, fatigue and weakness.  EYES: No blurred or double vision.  EARS, NOSE, AND THROAT: Has severe throat pain associated with difficulty swallowing No tinnitus or ear pain.  RESPIRATORY: No cough, shortness of breath, wheezing or hemoptysis.  CARDIOVASCULAR: No chest pain, orthopnea, edema.  GASTROINTESTINAL: No nausea, vomiting, diarrhea or abdominal pain.  GENITOURINARY: No dysuria, hematuria.  ENDOCRINE: No polyuria, nocturia,  HEMATOLOGY: No anemia, easy bruising or bleeding SKIN: No rash or lesion. MUSCULOSKELETAL: No joint pain or arthritis.   NEUROLOGIC: No tingling, numbness, weakness.  PSYCHIATRY: No anxiety or depression.   MEDICATIONS AT HOME:   Prior to Admission medications   Medication Sig Start Date End Date Taking? Authorizing Provider  amoxicillin-clavulanate (AUGMENTIN) 875-125 MG per tablet Take 1 tablet by mouth 2 (two) times daily. 03/29/15 04/08/15  Darci Current, MD  oxyCODONE-acetaminophen (PERCOCET/ROXICET) 5-325 MG per tablet Take 1-2 tablets by mouth every 6 (six) hours as needed for moderate pain. Patient not taking: Reported on 03/29/2015 01/21/15   Tiney Rouge III, MD  predniSONE (DELTASONE) 20 MG tablet Take 3 tablets (60 mg total) by mouth daily. 03/29/15 04/01/16  Darci Current, MD      VITAL SIGNS:  Blood pressure 124/70, pulse 76, temperature 99.2 F (37.3 C), temperature source Oral, resp. rate 20, height  (1.676 m), weight 97.523 kg (215 lb), SpO2  98 %.  PHYSICAL EXAMINATION:  GENERAL:  24 y.o.-year-old patient lying in the bed with no acute distress.  EYES: Pupils equal, round, reactive to light and accommodation. No scleral icterus. Extraocular muscles intact.  HEENT: Head atraumatic, normocephalic. Oropharynx and nasopharynx clear.  NECK:  Supple, no jugular venous distention. Tonsils are erythematous, edematous with purulent  discharge but uvula is at midline. No thyroid enlargement, no tenderness.  LUNGS: Normal breath sounds bilaterally, no wheezing, rales,rhonchi or crepitation. No use of accessory muscles of respiration.  CARDIOVASCULAR: S1, S2 normal. No murmurs, rubs, or gallops.  ABDOMEN: Soft, nontender, nondistended. Bowel sounds present. No organomegaly or mass.  EXTREMITIES: No pedal edema, cyanosis, or clubbing.  NEUROLOGIC: Cranial nerves II through XII are intact. Muscle strength 5/5 in all extremities. Sensation intact. Gait not checked.  PSYCHIATRIC: The patient is alert and oriented x 3.  SKIN: No obvious rash, lesion, or ulcer.   LABORATORY PANEL:   CBC  Recent Labs Lab 03/30/15 1248  WBC 25.0*  HGB 13.8  HCT 42.4  PLT 214   ------------------------------------------------------------------------------------------------------------------  Chemistries   Recent Labs Lab 03/30/15 1248  NA 138  K 3.5  CL 108  CO2 22  GLUCOSE 94  BUN 10  CREATININE 0.74  CALCIUM 9.5  AST 26  ALT 17  ALKPHOS 119  BILITOT 0.6   ------------------------------------------------------------------------------------------------------------------  Cardiac Enzymes No results for input(s): TROPONINI in the last 168 hours. ------------------------------------------------------------------------------------------------------------------  RADIOLOGY:  Ct Soft Tissue Neck W Contrast  03/29/2015   CLINICAL DATA:  Tonsillitis, right greater than left with uvula deviation. Throat swelling.  EXAM: CT NECK WITH CONTRAST  TECHNIQUE: Multidetector CT imaging of the neck was performed using the standard protocol following the bolus administration of intravenous contrast.  CONTRAST:  75mL OMNIPAQUE IOHEXOL 300 MG/ML  SOLN  COMPARISON:  None.  FINDINGS: Pharynx and larynx: Tonsillar hypertrophy with tonsillar edema, right greater than left. No tonsillar or peritonsillar abscess. Enlarged adenoidal soft tissue. The  epiglottis is normal. No retropharyngeal fluid collection.  Salivary glands: Symmetric without surrounding inflammation or ductal dilatation.  Thyroid: Normal.  Lymph nodes: Prominent and enlarged lymph nodes in both cervical chains, likely reactive.  Vascular: Jugular veins and carotid arteries are patent.  Limited intracranial: Normal.  Visualized orbits: Normal.  Mastoids and visualized paranasal sinuses: Well aerated. No mucosal thickening, opacification or fluid level.  Skeleton: No abnormality.  Upper chest: Upper lungs are clear. No upper mediastinal adenopathy.  IMPRESSION: 1. Edematous tonsils with hypertrophy consistent with tonsillitis. No tonsillar or peritonsillar abscess. Prominent adenoidal soft tissue. 2. Cervical adenopathy is likely reactive.   Electronically Signed   By: Rubye Oaks M.D.   On: 03/29/2015 01:53    EKG:  No orders found for this or any previous visit.  IMPRESSION AND PLAN:    Acute tonsillitis with erythema and hypertrophy with dysphagia  no peritonsillar abscess per CAT scan We will get throat culture and sensitivity Continue Unasyn Decadron IV Pain management as needed Soft and pure diet as tolerated ENT consult is placed  2. Migraine headaches Will provide Fioricet as needed  #3 history of endometriosis Outpatient follow-up with GYN as recommended  Provide GI prophylaxis and DVT prophylaxis is not needed as the patient is ambulatory      All the records are reviewed and case discussed with ED provider. Management plans discussed with the patient, family and they are in agreement.  CODE STATUS: full code/ mom is the HCPOA  TOTAL TIME TAKING CARE OF THIS  PATIENT: 45 minutes.  Greater than 50% of the time was spent on coordination of care  Ramonita Lab M.D on 03/30/2015 at 6:18 PM  Between 7am to 6pm - Pager - [phone]  After 6pm go to [email or web address] - password [email or web address] [facility]  [facility]  Office   [phone]  CC: Primary care physician; No PCP Per Patient

## 2015-03-30 NOTE — ED Provider Notes (Signed)
[facility] Emergency Department Provider Note  ____________________________________________  Time seen: Approximately 12:31 PM  I have reviewed the triage vital signs and the nursing notes.   HISTORY  Chief Complaint Oral Swelling   HPI Krista Drake is a 24 y.o. female who presents to the emergency department for the second visit for sore throat. She was seen here early yesterday morning for sore throat and fever with difficulty swallowing and speaking. She was discharged home on antibiotics and advised to return to the emergency department if the symptoms returned or worsened. She states today that the symptoms are worse than yesterday. She has an appointment with ENT tomorrow, but does not feel she can wait. She denies fever today.   Past Medical History  Diagnosis Date  . Migraine   . Headache   . Endometriosis     Patient Active Problem List   Diagnosis Date Noted  . Appendicitis, acute 01/17/2015  . Abdominal pain, unspecified site 06/18/2013  . Hx of migraine headaches 06/18/2013    Past Surgical History  Procedure Laterality Date  . Wisdom tooth extraction    . Laparoscopic appendectomy N/A 01/17/2015    Procedure: APPENDECTOMY LAPAROSCOPIC;  Surgeon: Tiney Rouge III, MD;  Location: [facility] ORS;  Service: General;  Laterality: N/A;    Current Outpatient Rx  Name  Route  Sig  Dispense  Refill  . amoxicillin-clavulanate (AUGMENTIN) 875-125 MG per tablet   Oral   Take 1 tablet by mouth 2 (two) times daily.   20 tablet   0   . oxyCODONE-acetaminophen (PERCOCET/ROXICET) 5-325 MG per tablet   Oral   Take 1-2 tablets by mouth every 6 (six) hours as needed for moderate pain. Patient not taking: Reported on 03/29/2015   30 tablet   0   . predniSONE (DELTASONE) 20 MG tablet   Oral   Take 3 tablets (60 mg total) by mouth daily.   15 tablet   0     Allergies Caramel and Zoloft  Family History  Problem Relation Age of Onset  . Diabetes  Maternal Grandmother   . Diabetes Maternal Grandfather   . Cancer Paternal Grandmother     breast  . Anemia Mother     Social History Social History  Substance Use Topics  . Smoking status: Never Smoker   . Smokeless tobacco: Never Used  . Alcohol Use: No    Review of Systems Constitutional:Fever resolved Eyes: No visual changes. ENT: Sore throat.yes, Difficulty Swallowing -painful but possible. Respiratory: Denies shortness of breath. Gastrointestinal: No abdominal pain.  No nausea, no vomiting.  No diarrhea.  Genitourinary: Negative for dysuria. Musculoskeletal:Generalized body aches: no Skin: Rash: no  Neurological: Negative for headaches, focal weakness or numbness.  10-point ROS otherwise negative.  ____________________________________________   PHYSICAL EXAM:  VITAL SIGNS: ED Triage Vitals  Enc Vitals Group     BP 03/30/15 1129 126/99 mmHg     Pulse Rate 03/30/15 1129 122     Resp 03/30/15 1129 24     Temp 03/30/15 1129 97.9 F (36.6 C)     Temp Source 03/30/15 1129 Oral     SpO2 03/30/15 1129 97 %     Weight 03/30/15 1129 215 lb (97.523 kg)     Height 03/30/15 1129 5\' 6"  (1.676 m)     Head Cir --      Peak Flow --      Pain Score --      Pain Loc --  Pain Edu? --      Excl. in GC? --     Constitutional: Alert and oriented. Well appearing and in no acute distress. Eyes: Conjunctivae are normal. PERRL. EOMI. Head: Atraumatic. Nose: No congestion/rhinnorhea. Mouth/Throat: Mucous membranes are moist.  Oropharynx non-erythematous. Neck: No stridor.  Lymphatic: Lymphadenopathy: yes-submental as well as anterior cervical Cardiovascular: Normal rate, regular rhythm. Good peripheral circulation. Respiratory: Normal respiratory effort. Lungs CTAB. Gastrointestinal: Soft and nontender. Musculoskeletal: No lower extremity tenderness nor edema.   Neurologic:  Normal speech and language. No gross focal neurologic deficits are appreciated. Speech is normal.  No gait instability. Skin:  Skin is warm, dry and intact. No rash noted Psychiatric: Mood and affect are normal. Speech and behavior are normal.  ____________________________________________   LABS (all labs ordered are listed, but only abnormal results are displayed)  Labs Reviewed  CBC WITH DIFFERENTIAL/PLATELET  COMPREHENSIVE METABOLIC PANEL   ____________________________________________  EKG   ____________________________________________  RADIOLOGY  CT from 03/29/2015 reviewed. No abscess, edematous tonsils and hypertrophy consistent with tonsillitis. ____________________________________________   PROCEDURES  Procedure(s) performed: None  Critical Care performed: No  ____________________________________________   INITIAL IMPRESSION / ASSESSMENT AND PLAN / ED COURSE  Pertinent labs & imaging results that were available during my care of the patient were reviewed by me and considered in my medical decision making (see chart for details).  ----------------------------------------- 1:42 PM on 03/30/2015 -----------------------------------------  ENT paged for consult.   2:15 PM  Consulted with Dr. Jenne Campus who recommends admission to medicine with EBV titers, Unasyn 3.75 every 6 hours and Decadron q8 hours.  Some improvement after Solumedrol and Rocephin.  ____________________________________________   FINAL CLINICAL IMPRESSION(S) / ED DIAGNOSES  Final diagnoses:  None      Chinita Pester, FNP 03/30/15 1443  Jeanmarie Plant, MD 03/30/15 1500

## 2015-03-30 NOTE — Progress Notes (Signed)
ANTIBIOTIC CONSULT NOTE - INITIAL  Pharmacy Consult for ampicillin/sulbactam dosing Indication: tonsillitis  Allergies  Allergen Reactions  . Caramel Anaphylaxis  . Zoloft [Sertraline Hcl] Hives    Patient Measurements: Height:  (167.6 cm) Weight: 215 lb (97.523 kg) IBW/kg (Calculated) : 59.3  Vital Signs: Temp: 97.9 F (36.6 C) (09/15 1129) Temp Source: Oral (09/15 1129) BP: 128/71 mmHg (09/15 1346) Pulse Rate: 92 (09/15 1346) Intake/Output from previous day:   Intake/Output from this shift:    Labs:  Recent Labs  03/29/15 0009 03/30/15 1248  WBC 16.9* 25.0*  HGB 13.7 13.8  PLT 178 214  CREATININE 0.89 0.74   Estimated Creatinine Clearance: 129.9 mL/min (by C-G formula based on Cr of 0.74). No results for input(s): VANCOTROUGH, VANCOPEAK, VANCORANDOM, GENTTROUGH, GENTPEAK, GENTRANDOM, TOBRATROUGH, TOBRAPEAK, TOBRARND, AMIKACINPEAK, AMIKACINTROU, AMIKACIN in the last 72 hours.   Microbiology: Recent Results (from the past 720 hour(s))  Blood culture (routine x 2)     Status: None (Preliminary result)   Collection Time: 03/29/15 12:06 AM  Result Value Ref Range Status   Specimen Description BLOOD RIGHT HAND  Final   Special Requests BOTTLES DRAWN AEROBIC AND ANAEROBIC  Final   Culture NO GROWTH 1 DAY  Final   Report Status PENDING  Incomplete  Blood culture (routine x 2)     Status: None (Preliminary result)   Collection Time: 03/29/15 12:06 AM  Result Value Ref Range Status   Specimen Description BLOOD RIGHT ANTECUBITAL  Final   Special Requests BOTTLES DRAWN AEROBIC AND ANAEROBIC  Final   Culture NO GROWTH 1 DAY  Final   Report Status PENDING  Incomplete    Medical History: Past Medical History  Diagnosis Date  . Migraine   . Headache   . Endometriosis     Medications:  Anti-infectives    Start     Dose/Rate Route Frequency Ordered Stop   03/30/15 1445  ampicillin-sulbactam (UNASYN) 1.5 g in sodium chloride 0.9 % 50 mL IVPB     1.5  g 100 mL/hr over 30 Minutes Intravenous  Once 03/30/15 1412     03/30/15 1230  cefTRIAXone (ROCEPHIN) 1 g in dextrose 5 % 50 mL IVPB     1 g 100 mL/hr over 30 Minutes Intravenous  Once 03/30/15 1223 03/30/15 1317     Assessment: Patient was seen yesterday in ED and diagnosed with tonsillitis. Patient returned today and pharmacy consulted to dose ampicillin/sulbactam.  Plan:  Patient has normal renal function, ordered ampicillin/sulbactam 1.5 g IV q6h. Pharmacy will continue to monitor and adjust dose if needed.   Thank you for the consult.    Jodelle Red Kendell Sagraves 03/30/2015,2:13 PM

## 2015-03-30 NOTE — ED Notes (Addendum)
Pt comes into the ED via POV from home with c/o feeling like she couldn't breathing with throat swelling .the patient was seen here yesterday dx with tonsilitis.Marland Kitchenthe patient is tearful.after being triage for a moment, pt has calmed down .Marland Kitchen   Airway is patent, O2 WNL

## 2015-03-31 LAB — CBC
HCT: 40.3 % (ref 35.0–47.0)
Hemoglobin: 12.9 g/dL (ref 12.0–16.0)
MCH: 27.2 pg (ref 26.0–34.0)
MCHC: 32.1 g/dL (ref 32.0–36.0)
MCV: 84.8 fL (ref 80.0–100.0)
PLATELETS: 185 10*3/uL (ref 150–440)
RBC: 4.75 MIL/uL (ref 3.80–5.20)
RDW: 15.2 % — AB (ref 11.5–14.5)
WBC: 13.5 10*3/uL — ABNORMAL HIGH (ref 3.6–11.0)

## 2015-03-31 LAB — BASIC METABOLIC PANEL
Anion gap: 7 (ref 5–15)
BUN: 11 mg/dL (ref 6–20)
CHLORIDE: 111 mmol/L (ref 101–111)
CO2: 24 mmol/L (ref 22–32)
CREATININE: 0.68 mg/dL (ref 0.44–1.00)
Calcium: 8.7 mg/dL — ABNORMAL LOW (ref 8.9–10.3)
GFR calc Af Amer: >60 mL/min (ref >60)
GFR calc non Af Amer: >60 mL/min (ref >60)
Glucose, Bld: 128 mg/dL — ABNORMAL HIGH (ref 65–99)
Potassium: 3.7 mmol/L (ref 3.5–5.1)
Sodium: 142 mmol/L (ref 135–145)

## 2015-03-31 LAB — EPSTEIN-BARR VIRUS VCA ANTIBODY PANEL
EBV Early Antigen Ab, IgG: <9 U/mL (ref 0.0–8.9)
EBV NA IgG: 419 U/mL — ABNORMAL HIGH (ref 0.0–17.9)
EBV VCA IGG: 280 U/mL — AB (ref 0.0–17.9)

## 2015-03-31 MED ORDER — PROMETHAZINE HCL 25 MG/ML IJ SOLN
25.0000 mg | Freq: Four times a day (QID) | INTRAMUSCULAR | Status: DC | PRN
  Administered 2015-03-31 (×2): 25 mg via INTRAVENOUS
  Filled 2015-03-31 (×2): qty 1

## 2015-03-31 MED ORDER — DEXTROSE IN LACTATED RINGERS 5 % IV SOLN
INTRAVENOUS | Status: DC
  Administered 2015-03-31 – 2015-04-01 (×2): via INTRAVENOUS

## 2015-03-31 NOTE — Progress Notes (Signed)
Offer pt linen change or to get cleaned up pt declines at this time

## 2015-03-31 NOTE — Progress Notes (Signed)
[facility] - [place] at [facility]                                                                                                                                                                                            Patient Demographics   Krista Drake, is a 24 y.o. female, DOB - Nov 18, 1990, ZOX:[id number]  Admit date - 03/30/2015   Admitting Physician Ramonita Lab, MD  Outpatient Primary MD for the patient is No PCP Per Patient   LOS - 1  Subjective: Patient still complains of pain in her neck and swelling. Denies any chest pain or shortness     Review of Systems:   CONSTITUTIONAL: No documented fever. No fatigue, weakness. No weight gain, no weight loss.  EYES: No blurry or double vision.  ENT:  Positive for neck swelling and pain RESPIRATORY: No cough, no wheeze, no hemoptysis. No dyspnea.  CARDIOVASCULAR: No chest pain. No orthopnea. No palpitations. No syncope.  GASTROINTESTINAL: No nausea, no vomiting or diarrhea. No abdominal pain. No melena or hematochezia.  GENITOURINARY: No dysuria or hematuria.  ENDOCRINE: No polyuria or nocturia. No heat or cold intolerance.  HEMATOLOGY: No anemia. No bruising. No bleeding.  INTEGUMENTARY: No rashes. No lesions.  MUSCULOSKELETAL: No arthritis. No swelling. No gout.  NEUROLOGIC: No numbness, tingling, or ataxia. No seizure-type activity.  PSYCHIATRIC: No anxiety. No insomnia. No ADD.    Vitals:   Filed Vitals:   03/30/15 2339 03/31/15 0512 03/31/15 0801 03/31/15 1254  BP: 126/76 114/61 120/75 122/66  Pulse: 48 45 55 45  Temp: 97.7 F (36.5 C) 97.6 F (36.4 C) 98.6 F (37 C) 97.6 F (36.4 C)  TempSrc: Oral Oral Oral Oral  Resp: 21  18   Height:      Weight:      SpO2: 98%   99%    Wt Readings from Last 3 Encounters:  03/30/15 97.523 kg (215 lb)  01/30/15 105.235 kg (232 lb)  01/17/15 99.066 kg (218 lb 6.4 oz)     Intake/Output Summary (Last 24 hours) at 03/31/15 1330 Last data filed at  03/31/15 0940  Gross per 24 hour  Intake   1457 ml  Output      0 ml  Net   1457 ml    Physical Exam:   GENERAL: Pleasant-appearing in no apparent distress.  HEAD, EYES, EARS, NOSE AND THROAT: Tonsillar enlargement with exudates NECK: Asymmetrical swelling mild HEART: Regular rate and rhythm,. No murmurs, no rubs, no clicks.  LUNGS: Clear to auscultation bilaterally. No rales or rhonchi. No wheezes.  ABDOMEN: Soft, flat, nontender, nondistended. Has good bowel  sounds. No hepatosplenomegaly appreciated.  EXTREMITIES: No evidence of any cyanosis, clubbing, or peripheral edema.  +2 pedal and radial pulses bilaterally.  NEUROLOGIC: The patient is alert, awake, and oriented x3 with no focal motor or sensory deficits appreciated bilaterally.  SKIN: Moist and warm with no rashes appreciated.  Psych: Not anxious, depressed LN: No inguinal LN enlargement    Antibiotics   Anti-infectives    Start     Dose/Rate Route Frequency Ordered Stop   03/30/15 2330  Ampicillin-Sulbactam (UNASYN) 3 g in sodium chloride 0.9 % 100 mL IVPB     3 g 100 mL/hr over 60 Minutes Intravenous Every 6 hours 03/30/15 1844     03/30/15 2100  ampicillin-sulbactam (UNASYN) 1.5 g in sodium chloride 0.9 % 50 mL IVPB  Status:  Discontinued     1.5 g 100 mL/hr over 30 Minutes Intravenous Every 6 hours 03/30/15 1420 03/30/15 1839   03/30/15 2100  Ampicillin-Sulbactam (UNASYN) 3 g in sodium chloride 0.9 % 100 mL IVPB  Status:  Discontinued     3 g 100 mL/hr over 60 Minutes Intravenous Every 12 hours 03/30/15 1840 03/30/15 1844   03/30/15 1445  ampicillin-sulbactam (UNASYN) 1.5 g in sodium chloride 0.9 % 50 mL IVPB     1.5 g 100 mL/hr over 30 Minutes Intravenous  Once 03/30/15 1412 03/30/15 1805   03/30/15 1230  cefTRIAXone (ROCEPHIN) 1 g in dextrose 5 % 50 mL IVPB     1 g 100 mL/hr over 30 Minutes Intravenous  Once 03/30/15 1223 03/30/15 1600      Medications   Scheduled Meds: . ampicillin-sulbactam (UNASYN) IV   3 g Intravenous Q6H  . dexamethasone  10 mg Intravenous Q8H  . sodium chloride  3 mL Intravenous Q12H   Continuous Infusions:  PRN Meds:.acetaminophen **OR** acetaminophen, ketorolac, morphine injection, ondansetron **OR** ondansetron (ZOFRAN) IV   Data Review:   Micro Results Recent Results (from the past 240 hour(s))  Blood culture (routine x 2)     Status: None (Preliminary result)   Collection Time: 03/29/15 12:06 AM  Result Value Ref Range Status   Specimen Description BLOOD RIGHT HAND  Final   Special Requests BOTTLES DRAWN AEROBIC AND ANAEROBIC  Final   Culture NO GROWTH 2 DAYS  Final   Report Status PENDING  Incomplete  Blood culture (routine x 2)     Status: None (Preliminary result)   Collection Time: 03/29/15 12:06 AM  Result Value Ref Range Status   Specimen Description BLOOD RIGHT ANTECUBITAL  Final   Special Requests BOTTLES DRAWN AEROBIC AND ANAEROBIC  Final   Culture NO GROWTH 2 DAYS  Final   Report Status PENDING  Incomplete  Culture, group A strep ([facility] only)     Status: None (Preliminary result)   Collection Time: 03/30/15  3:03 PM  Result Value Ref Range Status   Specimen Description THROAT  Final   Special Requests NONE  Final   Culture NO BETA HEMOLYTIC STREPTOCOCCI ISOLATED  Final   Report Status PENDING  Incomplete    Radiology Reports Ct Soft Tissue Neck W Contrast  03/29/2015   CLINICAL DATA:  Tonsillitis, right greater than left with uvula deviation. Throat swelling.  EXAM: CT NECK WITH CONTRAST  TECHNIQUE: Multidetector CT imaging of the neck was performed using the standard protocol following the bolus administration of intravenous contrast.  CONTRAST:  75mL OMNIPAQUE IOHEXOL 300 MG/ML  SOLN  COMPARISON:  None.  FINDINGS: Pharynx and larynx: Tonsillar hypertrophy with tonsillar  edema, right greater than left. No tonsillar or peritonsillar abscess. Enlarged adenoidal soft tissue. The epiglottis is normal. No retropharyngeal fluid collection.   Salivary glands: Symmetric without surrounding inflammation or ductal dilatation.  Thyroid: Normal.  Lymph nodes: Prominent and enlarged lymph nodes in both cervical chains, likely reactive.  Vascular: Jugular veins and carotid arteries are patent.  Limited intracranial: Normal.  Visualized orbits: Normal.  Mastoids and visualized paranasal sinuses: Well aerated. No mucosal thickening, opacification or fluid level.  Skeleton: No abnormality.  Upper chest: Upper lungs are clear. No upper mediastinal adenopathy.  IMPRESSION: 1. Edematous tonsils with hypertrophy consistent with tonsillitis. No tonsillar or peritonsillar abscess. Prominent adenoidal soft tissue. 2. Cervical adenopathy is likely reactive.   Electronically Signed   By: Rubye Oaks M.D.   On: 03/29/2015 01:53     CBC  Recent Labs Lab 03/29/15 0009 03/30/15 1248 03/31/15 0653  WBC 16.9* 25.0* 13.5*  HGB 13.7 13.8 12.9  HCT 41.5 42.4 40.3  PLT 178 214 185  MCV 83.8 83.9 84.8  MCH 27.8 27.2 27.2  MCHC 33.1 32.5 32.1  RDW 15.2* 15.3* 15.2*  LYMPHSABS  --  0.9*  --   MONOABS  --  1.0*  --   EOSABS  --  0.1  --   BASOSABS  --  0.4*  --     Chemistries   Recent Labs Lab 03/29/15 0009 03/30/15 1248 03/31/15 0653  NA 137 138 142  K 3.3* 3.5 3.7  CL 104 108 111  CO2 23 22 24   GLUCOSE 92 94 128*  BUN 6 10 11   CREATININE 0.89 0.74 0.68  CALCIUM 8.9 9.5 8.7*  AST 18 26  --   ALT 16 17  --   ALKPHOS 133* 119  --   BILITOT 0.8 0.6  --    ------------------------------------------------------------------------------------------------------------------ estimated creatinine clearance is 129.9 mL/min (by C-G formula based on Cr of 0.68). ------------------------------------------------------------------------------------------------------------------ No results for input(s): HGBA1C in the last 72 hours. ------------------------------------------------------------------------------------------------------------------ No  results for input(s): CHOL, HDL, LDLCALC, TRIG, CHOLHDL, LDLDIRECT in the last 72 hours. ------------------------------------------------------------------------------------------------------------------ No results for input(s): TSH, T4TOTAL, T3FREE, THYROIDAB in the last 72 hours.  Invalid input(s): FREET3 ------------------------------------------------------------------------------------------------------------------ No results for input(s): VITAMINB12, FOLATE, FERRITIN, TIBC, IRON, RETICCTPCT in the last 72 hours.  Coagulation profile No results for input(s): INR, PROTIME in the last 168 hours.  No results for input(s): DDIMER in the last 72 hours.  Cardiac Enzymes No results for input(s): CKMB, TROPONINI, MYOGLOBIN in the last 168 hours.  Invalid input(s): CK ------------------------------------------------------------------------------------------------------------------ Invalid input(s): POCBNP    Assessment & Plan   1. Acute tonsillitis with erythema and hypertrophy with dysphagia continue steroids and anabiotic's likely can be switched over to oral Augmentin tomorrow outpatient ENT follow-up in 10 days ENT feels that this may be due to EBV mono test is negative will send EBV titers   2. Migraine headaches Fioricet as needed  3. Miscellaneous patient is ambulatory     Code Status Orders        Start     Ordered   03/30/15 1649  Full code   Continuous     03/30/15 1648           Consults  ENT   DVT Prophylaxis ambulatory  Lab Results  Component Value Date   PLT 185 03/31/2015     Time Spent in minutes   Auburn Bilberry M.D on 03/31/2015 at 1:30 PM  Between 7am to 6pm - Pager - [phone]  After 6pm go to [email or web address] - password [email or web address] [email or web address]  [facility] [place] [facility] Hospital[facility]   Office  [phone]

## 2015-04-01 LAB — CBC
HEMATOCRIT: 42.1 % (ref 35.0–47.0)
HEMOGLOBIN: 13.6 g/dL (ref 12.0–16.0)
MCH: 27.5 pg (ref 26.0–34.0)
MCHC: 32.4 g/dL (ref 32.0–36.0)
MCV: 84.7 fL (ref 80.0–100.0)
Platelets: 205 10*3/uL (ref 150–440)
RBC: 4.97 MIL/uL (ref 3.80–5.20)
RDW: 15.4 % — ABNORMAL HIGH (ref 11.5–14.5)
WBC: 10.4 10*3/uL (ref 3.6–11.0)

## 2015-04-01 MED ORDER — AMOXICILLIN-POT CLAVULANATE 400-57 MG/5ML PO SUSR: ORAL | Status: DC

## 2015-04-01 MED ORDER — PREDNISOLONE 15 MG/5ML PO SOLN: ORAL | Status: DC

## 2015-04-01 MED ORDER — HYDROCODONE-ACETAMINOPHEN 7.5-325 MG/15ML PO SOLN: ORAL | Status: DC

## 2015-04-01 MED ORDER — PREDNISONE 10 MG (21) PO TBPK: 10.0000 mg | ORAL_TABLET | Freq: Every day | ORAL | Status: DC

## 2015-04-01 NOTE — Discharge Instructions (Signed)
Full liquid diet for 1-2 days and then soft diet  Finish up your prescription for antibiotic that you have,. Your steroids have been called into your pharmacy

## 2015-04-01 NOTE — Discharge Summary (Signed)
[facility] Phys[facility] - [place] at [facility]   PATIENT NAME: Krista Drake    MR#:  [id number]  DATE OF BIRTH:  1990/12/14  DATE OF ADMISSION:  03/30/2015 ADMITTING PHYSICIAN: Ramonita Lab, MD  DATE OF DISCHARGE: 04/01/15  PRIMARY CARE PHYSICIAN: No PCP Per Patient    ADMISSION DIAGNOSIS:  Acute tonsillitis [J03.90] Leukocytosis [D72.829] Adenopathy, cervical [R59.0]  DISCHARGE DIAGNOSIS:  Acute tonsillitis  SECONDARY DIAGNOSIS:   Past Medical History  Diagnosis Date  . Migraine   . Headache   . Endometriosis     HOSPITAL COURSE:   1. Acute tonsillitis with erythema and hypertrophy with dysphagia continue steroids and antibiotic switch to oral Augmentin and cont po steroid taper. ENT recommends Sterapred DS -prn percocet -pt will see Dr Jenne Campus in 10 days for possible tonsillectomy -spoke with Dr [facility] ENT on call -wbc down to 10K and pt afebrile, sats 98% on RA - f/u EBV titers 2. Migraine headaches Fioricet as needed  3. Miscellaneous patient is ambulatory  D/c home later today  DISCHARGE CONDITIONS:   fair  CONSULTS OBTAINED:  Treatment Team:  Linus Salmons, MD  DRUG ALLERGIES:   Allergies  Allergen Reactions  . Caramel Anaphylaxis  . Zoloft [Sertraline Hcl] Hives    DISCHARGE MEDICATIONS:   Current Discharge Medication List    CONTINUE these medications which have NOT CHANGED   Details  amoxicillin-clavulanate (AUGMENTIN) 875-125 MG per tablet Take 1 tablet by mouth 2 (two) times daily. Qty: 20 tablet, Refills: 0    oxyCODONE-acetaminophen (PERCOCET/ROXICET) 5-325 MG per tablet Take 1-2 tablets by mouth every 6 (six) hours as needed for moderate pain. Qty: 30 tablet, Refills: 0    predniSONE (DELTASONE) 20 MG tablet Take 3 tablets (60 mg total) by mouth daily. Qty: 15 tablet, Refills: 0        If you experience worsening of your admission symptoms, develop shortness of breath, life threatening emergency, suicidal or  homicidal thoughts you must seek medical attention immediately by calling 911 or calling your MD immediately  if symptoms less severe.  You Must read complete instructions/literature along with all the possible adverse reactions/side effects for all the Medicines you take and that have been prescribed to you. Take any new Medicines after you have completely understood and accept all the possible adverse reactions/side effects.   Please note  You were cared for by a hospitalist during your hospital stay. If you have any questions about your discharge medications or the care you received while you were in the hospital after you are discharged, you can call the unit and asked to speak with the hospitalist on call if the hospitalist that took care of you is not available. Once you are discharged, your primary care physician will handle any further medical issues. Please note that NO REFILLS for any discharge medications will be authorized once you are discharged, as it is imperative that you return to your primary care physician (or establish a relationship with a primary care physician if you do not have one) for your aftercare needs so that they can reassess your need for medications and monitor your lab values. Today   SUBJECTIVE   some throat pain. Able to swallow liquid diet   VITAL SIGNS:  Blood pressure 134/91, pulse 67, temperature 96.9 F (36.1 C), temperature source Axillary, resp. rate 20, height  (1.676 m), weight 97.523 kg (215 lb), SpO2 98 %.  I/O:   Intake/Output Summary (Last 24 hours) at 04/01/15 1243 Last  data filed at 04/01/15 1100  Gross per 24 hour  Intake    240 ml  Output    900 ml  Net   -660 ml    PHYSICAL EXAMINATION:  GENERAL:  24 y.o.-year-old patient lying in the bed with no acute distress.  EYES: Pupils equal, round, reactive to light and accommodation. No scleral icterus. Extraocular muscles intact.  HEENT: Head atraumatic, normocephalic. Oropharynx and  nasopharynx clear. Throat swelling mild NECK:  Supple, no jugular venous distention. No thyroid enlargement, no tenderness.  LUNGS: Normal breath sounds bilaterally, no wheezing, rales,rhonchi or crepitation. No use of accessory muscles of respiration.  CARDIOVASCULAR: S1, S2 normal. No murmurs, rubs, or gallops.  ABDOMEN: Soft, non-tender, non-distended. Bowel sounds present. No organomegaly or mass.  EXTREMITIES: No pedal edema, cyanosis, or clubbing.  NEUROLOGIC: Cranial nerves II through XII are intact. Muscle strength 5/5 in all extremities. Sensation intact. Gait not checked.  PSYCHIATRIC: The patient is alert and oriented x 3.  SKIN: No obvious rash, lesion, or ulcer.   DATA REVIEW:   CBC   Recent Labs Lab 04/01/15 0746  WBC 10.4  HGB 13.6  HCT 42.1  PLT 205    Chemistries   Recent Labs Lab 03/30/15 1248 03/31/15 0653  NA 138 142  K 3.5 3.7  CL 108 111  CO2 22 24  GLUCOSE 94 128*  BUN 10 11  CREATININE 0.74 0.68  CALCIUM 9.5 8.7*  AST 26  --   ALT 17  --   ALKPHOS 119  --   BILITOT 0.6  --     Microbiology Results   Recent Results (from the past 240 hour(s))  Blood culture (routine x 2)     Status: None (Preliminary result)   Collection Time: 03/29/15 12:06 AM  Result Value Ref Range Status   Specimen Description BLOOD RIGHT HAND  Final   Special Requests BOTTLES DRAWN AEROBIC AND ANAEROBIC  Final   Culture NO GROWTH 3 DAYS  Final   Report Status PENDING  Incomplete  Blood culture (routine x 2)     Status: None (Preliminary result)   Collection Time: 03/29/15 12:06 AM  Result Value Ref Range Status   Specimen Description BLOOD RIGHT ANTECUBITAL  Final   Special Requests BOTTLES DRAWN AEROBIC AND ANAEROBIC  Final   Culture NO GROWTH 3 DAYS  Final   Report Status PENDING  Incomplete  Culture, group A strep ([facility] only)     Status: None (Preliminary result)   Collection Time: 03/30/15  3:03 PM  Result Value Ref Range Status   Specimen  Description THROAT  Final   Special Requests NONE  Final   Culture HOLDING FOR POSSIBLE PATHOGEN  Final   Report Status PENDING  Incomplete    RADIOLOGY:  No results found.   Management plans discussed with the patient, family and they are in agreement.  CODE STATUS:     Code Status Orders        Start     Ordered   03/30/15 1649  Full code   Continuous     03/30/15 1648      TOTAL TIME TAKING CARE OF THIS PATIENT: 40 minutes.    PATEL,SONA M.D on 04/01/2015 at 12:43 PM  Between 7am to 6pm - Pager - [phone] After 6pm go to [email or web address] - password [email or web address] [facility]  [place] [facility]  Office  [phone]  CC: Primary care physician; No PCP Per Patient

## 2015-04-01 NOTE — Consult Note (Signed)
Krista Drake, Krista Drake [id number] April 14, 1991 Krista Nearing, MD  Reason for Consult: Sore throat Requesting Physician: Krista Mandes, MD Consulting Physician: Krista Nearing, MD  HPI: This 24 y.o. year old female was admitted on 03/30/2015 for Acute tonsillitis [J03.90] Leukocytosis [D72.829] Adenopathy, cervical [R59.0]. She was eating popsicles well earlier and was prepared for discharge, then became upset at the time of discharge, and now feels she cannot swallow. She also refused to allow Krista Drake to look at her throat. I have reviewed the chart and I reviewed the CT scan. The scan shows no evidence of abscess. At the time of the scan, the tonsils were swollen to the point of touching and she had an elevated white count. White count is now normal. No nausea/ vomiting.  Medications:  Current Facility-Administered Medications  Medication Dose Route Frequency Krista Drake Last Rate Last Dose  . acetaminophen (TYLENOL) tablet 650 mg  650 mg Oral Q6H PRN Krista Mango, MD   650 mg at 03/30/15 1932   Or  . acetaminophen (TYLENOL) suppository 650 mg  650 mg Rectal Q6H PRN Krista Mango, MD      . ketorolac (TORADOL) 15 MG/ML injection 15 mg  15 mg Intravenous Q4H PRN Krista Mango, MD   15 mg at 04/01/15 0815  . morphine 2 MG/ML injection 2 mg  2 mg Intravenous Q4H PRN Krista Mango, MD   2 mg at 04/01/15 1455  . ondansetron (ZOFRAN) tablet 4 mg  4 mg Oral Q6H PRN Krista Mango, MD       Or  . ondansetron (ZOFRAN) injection 4 mg  4 mg Intravenous Q6H PRN Krista Mango, MD      . promethazine (PHENERGAN) injection 25 mg  25 mg Intravenous Q6H PRN Krista Flock, MD   25 mg at 03/31/15 2310  . sodium chloride 0.9 % injection 3 mL  3 mL Intravenous Q12H Krista Mango, MD   3 mL at 03/30/15 2222  .  Medications Prior to Admission  Medication Sig Dispense Refill  . amoxicillin-clavulanate (AUGMENTIN) 875-125 MG per tablet Take 1 tablet by mouth 2 (two) times daily. 20 tablet 0  . oxyCODONE-acetaminophen  (PERCOCET/ROXICET) 5-325 MG per tablet Take 1-2 tablets by mouth every 6 (six) hours as needed for moderate pain. (Patient not taking: Reported on 03/29/2015) 30 tablet 0  . predniSONE (DELTASONE) 20 MG tablet Take 3 tablets (60 mg total) by mouth daily. 15 tablet 0    Allergies:  Allergies  Allergen Reactions  . Caramel Anaphylaxis  . Zoloft [Sertraline Hcl] Hives    PMH:  Past Medical History  Diagnosis Date  . Migraine   . Headache   . Endometriosis     Fam Hx:  Family History  Problem Relation Age of Onset  . Diabetes Maternal Grandmother   . Diabetes Maternal Grandfather   . Cancer Paternal Grandmother     breast  . Anemia Mother     Soc Hx:  Social History   Social History  . Marital Status: Single    Spouse Name: N/A  . Number of Children: N/A  . Years of Education: N/A   Occupational History  . Not on file.   Social History Main Topics  . Smoking status: Never Smoker   . Smokeless tobacco: Never Used  . Alcohol Use: No  . Drug Use: No  . Sexual Activity: Not on file   Other Topics Concern  . Not on file   Social History Narrative    PSH:  Past Surgical  History  Procedure Laterality Date  . Wisdom tooth extraction    . Laparoscopic appendectomy N/A 01/17/2015    Procedure: APPENDECTOMY LAPAROSCOPIC;  Surgeon: Krista Crawford III, MD;  Location: [facility] ORS;  Service: General;  Laterality: N/A;  . Procedures since admission: No admission procedures for hospital encounter.  ROS: Review of systems normal other than 12 systems except per HPI.  PHYSICAL EXAM  Vitals: Blood pressure 134/91, pulse 67, temperature 96.9 F (36.1 C), temperature source Axillary, resp. rate 20, height 5' 6"  (1.676 m), weight 97.523 kg (215 lb), SpO2 98 %.. General: Well-developed, Well-nourished in no acute distress Mood: Mood and affect: seems cooperative Orientation: Grossly alert and oriented. Vocal Quality: No hoarseness. Communicates verbally. head and Face: NCAT. No facial  asymmetry. No visible skin lesions. No significant facial scars. No tenderness with sinus percussion. Facial strength normal and symmetric. Oral Cavity/ Oropharynx: Lips are normal with no lesions. Teeth no frank dental caries. Gingiva healthy with no lesions or gingivitis. Oropharynx including tongue, buccal mucosa, floor of mouth, hard and soft palate, uvula and posterior pharynx free of exudates, erythema or lesions with normal symmetry and hydration. Tonsils are 3+ in size, no longer "touching", and without exudate. There is no thrush. There is no evidence of peritonsillar abscess: no deviation of uvula or swelling on the palate. Indirect Laryngoscopy/Nasopharyngoscopy: Visualization of the larynx, hypopharynx and nasopharynx is not possible in this setting with routine examination. Neck: Supple and symmetric with no palpable masses, tenderness or crepitance. The trachea is midline. Thyroid gland is soft, nontender and symmetric with no masses or enlargement. Parotid and submandibular glands are soft, nontender and symmetric, without masses. Lymphatic: Cervical lymph nodes are without palpable lymphadenopathy or tenderness. Respiratory: Normal respiratory effort without labored breathing. Cardiovascular: Carotid pulse shows regular rate and rhythm Neurologic: Cranial Nerves II through XII are grossly intact. Eyes: Gaze and Ocular Motility are grossly normal. PERRLA. No visible nystagmus.  MEDICAL DECISION MAKING: Data Review:  Results for orders placed or performed during the hospital encounter of 03/30/15 (from the past 48 hour(s))  Basic metabolic panel     Status: Abnormal   Collection Time: 03/31/15  6:53 AM  Result Value Ref Range   Sodium 142 135 - 145 mmol/L   Potassium 3.7 3.5 - 5.1 mmol/L   Chloride 111 101 - 111 mmol/L   CO2 24 22 - 32 mmol/L   Glucose, Bld 128 (H) 65 - 99 mg/dL   BUN 11 6 - 20 mg/dL   Creatinine, Ser 0.68 0.44 - 1.00 mg/dL   Calcium 8.7 (L) 8.9 - 10.3 mg/dL    GFR calc non Af Amer >60 >60 mL/min   GFR calc Af Amer >60 >60 mL/min    Comment: (NOTE) The eGFR has been calculated using the CKD EPI equation. This calculation has not been validated in all clinical situations. eGFR's persistently <60 mL/min signify possible Chronic Kidney Disease.    Anion gap 7 5 - 15  CBC     Status: Abnormal   Collection Time: 03/31/15  6:53 AM  Result Value Ref Range   WBC 13.5 (H) 3.6 - 11.0 K/uL   RBC 4.75 3.80 - 5.20 MIL/uL   Hemoglobin 12.9 12.0 - 16.0 g/dL   HCT 40.3 35.0 - 47.0 %   MCV 84.8 80.0 - 100.0 fL   MCH 27.2 26.0 - 34.0 pg   MCHC 32.1 32.0 - 36.0 g/dL   RDW 15.2 (H) 11.5 - 14.5 %   Platelets 185 150 -  440 K/uL  CBC     Status: Abnormal   Collection Time: 04/01/15  7:46 AM  Result Value Ref Range   WBC 10.4 3.6 - 11.0 K/uL   RBC 4.97 3.80 - 5.20 MIL/uL   Hemoglobin 13.6 12.0 - 16.0 g/dL   HCT 42.1 35.0 - 47.0 %   MCV 84.7 80.0 - 100.0 fL   MCH 27.5 26.0 - 34.0 pg   MCHC 32.4 32.0 - 36.0 g/dL   RDW 15.4 (H) 11.5 - 14.5 %   Platelets 205 150 - 440 K/uL  . No results found..     ASSESSMENT: Acute tonsillitis with tonsil hypertrophy. Based on the normal white count and reduction in tonsil swelling compared to the CT scan, there has clearly been significant medical improvement. Earlier she seemed more motivated to PO intake, and this has declined since the suggestion she would go home. 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 She is appropriately managed with antibiotics and steroids, and clinically I see no other reason she cannot tolerate a liquid diet. There seems to be some other issue and she seems to have some somatization out of proportion to what I would expect. I stressed to the patient and the mother that she will need to tolerate some degree of pain and push to advance her diet. The fact she was getting popsicles down without an issue earlier would suggest she is as capable as any of  our post-tonsillectomy patients are in terms of PO intake, and they are discharged home. I would suggest switching her medications to all liquid to facilitate easier swallowing.  PLAN: If she will agree to discharge, I see no reason she cannot go home on a liquid diet. Will convert all pills to liquid. She will follow-up with Dr. Tami Ribas.   Krista Nearing, MD 04/01/2015 4:24 PM

## 2015-04-01 NOTE — Progress Notes (Signed)
All discharge instructions given to patient and she voices understanding of all instructions given. Iv d/c'd pt tolerated well, site benign. She will call on Monday and make an appt for Friday. Note given per Dr Willeen Cass to be out of work until next Friday the 23rd when she sees Dr Jenne Campus. Prescriptions given.  Patient discharged home with her mom.

## 2015-04-01 NOTE — Progress Notes (Signed)
  April 01, 2015  Patient: Krista Drake  Date of Birth: 1991/04/27  Date of Visit: 03/30/2015    To Whom It May Concern:  Bristal Steffy was seen and treated in our Hospital on 03/30/2015 through 04/01/2015. Leone Haven  may return to work on 04/07/2015.  Sincerely,

## 2015-04-02 LAB — CULTURE, GROUP A STREP (THRC)

## 2015-04-03 LAB — CULTURE, BLOOD (ROUTINE X 2)
CULTURE: NO GROWTH
CULTURE: NO GROWTH

## 2015-05-14 ENCOUNTER — Emergency Department
Admission: EM | Admit: 2015-05-14 | Discharge: 2015-05-14 | Disposition: A | Discharge status: Home / Self Care | Payer: Federal, State, Local not specified - PPO | Attending: Emergency Medicine | Admitting: Emergency Medicine

## 2015-05-14 DIAGNOSIS — N390 Urinary tract infection, site not specified: Secondary | ICD-10-CM | POA: Insufficient documentation

## 2015-05-14 DIAGNOSIS — Z792 Long term (current) use of antibiotics: Secondary | ICD-10-CM | POA: Insufficient documentation

## 2015-05-14 DIAGNOSIS — R110 Nausea: Secondary | ICD-10-CM | POA: Insufficient documentation

## 2015-05-14 DIAGNOSIS — Z3202 Encounter for pregnancy test, result negative: Secondary | ICD-10-CM | POA: Diagnosis not present

## 2015-05-14 DIAGNOSIS — R109 Unspecified abdominal pain: Secondary | ICD-10-CM | POA: Diagnosis present

## 2015-05-14 LAB — URINALYSIS COMPLETE WITH MICROSCOPIC (ARMC ONLY)
BILIRUBIN URINE: NEGATIVE
GLUCOSE, UA: NEGATIVE mg/dL
KETONES UR: NEGATIVE mg/dL
NITRITE: NEGATIVE
Protein, ur: 30 mg/dL — AB
Specific Gravity, Urine: 1.013 (ref 1.005–1.030)
pH: 6 (ref 5.0–8.0)

## 2015-05-14 LAB — CBC
HCT: 40.8 % (ref 35.0–47.0)
HEMOGLOBIN: 13.6 g/dL (ref 12.0–16.0)
MCH: 28.3 pg (ref 26.0–34.0)
MCHC: 33.2 g/dL (ref 32.0–36.0)
MCV: 85.2 fL (ref 80.0–100.0)
PLATELETS: 175 10*3/uL (ref 150–440)
RBC: 4.79 MIL/uL (ref 3.80–5.20)
RDW: 15.2 % — AB (ref 11.5–14.5)
WBC: 7.2 10*3/uL (ref 3.6–11.0)

## 2015-05-14 LAB — COMPREHENSIVE METABOLIC PANEL
ALBUMIN: 4.3 g/dL (ref 3.5–5.0)
ALK PHOS: 116 U/L (ref 38–126)
ALT: 21 U/L (ref 14–54)
ANION GAP: 5 (ref 5–15)
AST: 20 U/L (ref 15–41)
BILIRUBIN TOTAL: 0.5 mg/dL (ref 0.3–1.2)
BUN: 9 mg/dL (ref 6–20)
CALCIUM: 9.3 mg/dL (ref 8.9–10.3)
CO2: 22 mmol/L (ref 22–32)
CREATININE: 0.64 mg/dL (ref 0.44–1.00)
Chloride: 110 mmol/L (ref 101–111)
GFR calc Af Amer: >60 mL/min (ref >60)
GFR calc non Af Amer: >60 mL/min (ref >60)
GLUCOSE: 78 mg/dL (ref 65–99)
Potassium: 3.8 mmol/L (ref 3.5–5.1)
Sodium: 137 mmol/L (ref 135–145)
TOTAL PROTEIN: 8.3 g/dL — AB (ref 6.5–8.1)

## 2015-05-14 LAB — POCT PREGNANCY, URINE: Preg Test, Ur: NEGATIVE

## 2015-05-14 LAB — LIPASE, BLOOD: Lipase: 16 U/L (ref 11–51)

## 2015-05-14 MED ORDER — CEFTRIAXONE SODIUM 1 G IJ SOLR
INTRAMUSCULAR | Status: AC
  Filled 2015-05-14: qty 10

## 2015-05-14 MED ORDER — LIDOCAINE HCL (PF) 1 % IJ SOLN
INTRAMUSCULAR | Status: AC
  Filled 2015-05-14: qty 5

## 2015-05-14 MED ORDER — DEXTROSE 5 % IV SOLN: 1.0000 g | Freq: Once | INTRAVENOUS | Status: DC

## 2015-05-14 MED ORDER — CIPROFLOXACIN HCL 500 MG PO TABS: 500.0000 mg | ORAL_TABLET | Freq: Two times a day (BID) | ORAL | Status: DC

## 2015-05-14 MED ORDER — CEFTRIAXONE SODIUM 1 G IJ SOLR
1.0000 g | Freq: Once | INTRAMUSCULAR | Status: AC
  Administered 2015-05-14: 1 g via INTRAMUSCULAR

## 2015-05-14 NOTE — ED Provider Notes (Signed)
[facility] Emergency Department Provider Note  ____________________________________________  Time seen: On arrival  I have reviewed the triage vital signs and the nursing notes.   HISTORY  Chief Complaint Abdominal Pain and Back Pain    HPI Krista Drake is a 24 y.o. female who presents with complaints of bilateral flank moderate cramping pain that radiates into the lower abdomen. She complains of some mild nausea. No fevers no chills. She denies dysuria but does report some frequency. She has no history of diabetes. She denies foul smelling urine. She has had an appendectomy in the past. And she denies vaginal discharge     Past Medical History  Diagnosis Date  . Migraine   . Headache   . Endometriosis     Patient Active Problem List   Diagnosis Date Noted  . Acute tonsillitis 03/30/2015  . Appendicitis, acute 01/17/2015  . Abdominal pain, unspecified site 06/18/2013  . Hx of migraine headaches 06/18/2013    Past Surgical History  Procedure Laterality Date  . Wisdom tooth extraction    . Laparoscopic appendectomy N/A 01/17/2015    Procedure: APPENDECTOMY LAPAROSCOPIC;  Surgeon: Tiney Rougealph Ely III, MD;  Location: [facility] ORS;  Service: General;  Laterality: N/A;    Current Outpatient Rx  Name  Route  Sig  Dispense  Refill  . amoxicillin-clavulanate (AUGMENTIN) 400-57 MG/5ML suspension      2 1/4 teaspoons by mouth twice daily for 10 days   250 mL   0   . HYDROcodone-acetaminophen (HYCET) 7.5-325 mg/15 ml solution      10-15 cc PO every 4-6 hours as needed for pain   300 mL   0   . prednisoLONE (PRELONE) 15 MG/5ML SOLN      10 cc PO BID x 3 days, 5 cc PO BID x 3 days, 5 cc PO QD x 3 days   1 Bottle   0     Allergies Caramel and Zoloft  Family History  Problem Relation Age of Onset  . Diabetes Maternal Grandmother   . Diabetes Maternal Grandfather   . Cancer Paternal Grandmother     breast  . Anemia Mother     Social  History Social History  Substance Use Topics  . Smoking status: Never Smoker   . Smokeless tobacco: Never Used  . Alcohol Use: No    Review of Systems  Constitutional: Negative for fever. Eyes: Negative for visual changes. ENT: Negative for sore throat Cardiovascular: Negative for chest pain. Respiratory: Negative for shortness of breath. Gastrointestinal: Negative for abdominal pain, vomiting and diarrhea. Genitourinary: Negative for dysuria. Positive for frequency Musculoskeletal: Positive for back pain Skin: Negative for rash. Neurological: Negative for headaches or focal weakness Psychiatric: No anxiety    ____________________________________________   PHYSICAL EXAM:  VITAL SIGNS: ED Triage Vitals  Enc Vitals Group     BP 05/14/15 2000 137/77 mmHg     Pulse Rate 05/14/15 2000 87     Resp 05/14/15 2000 20     Temp 05/14/15 2000 98.9 F (37.2 C)     Temp Source 05/14/15 2000 Oral     SpO2 05/14/15 2000 96 %     Weight 05/14/15 2000 215 lb (97.523 kg)     Height 05/14/15 2000 5\' 6"  (1.676 m)     Head Cir --      Peak Flow --      Pain Score 05/14/15 2001 9     Pain Loc --  Pain Edu? --      Excl. in GC? --      Constitutional: Alert and oriented. Well appearing and in no distress. Eyes: Conjunctivae are normal.  ENT   Head: Normocephalic and atraumatic.   Mouth/Throat: Mucous membranes are moist. Cardiovascular: Normal rate, regular rhythm. Normal and symmetric distal pulses are present in all extremities. No murmurs, rubs, or gallops. Respiratory: Normal respiratory effort without tachypnea nor retractions. Breath sounds are clear and equal bilaterally.  Gastrointestinal: Soft and non-tender in all quadrants. No distention. There is no CVA tenderness. Genitourinary: deferred Musculoskeletal: Nontender with normal range of motion in all extremities. No lower extremity tenderness nor edema. Neurologic:  Normal speech and language. No gross focal  neurologic deficits are appreciated. Skin:  Skin is warm, dry and intact. No rash noted. Psychiatric: Mood and affect are normal. Patient exhibits appropriate insight and judgment.  ____________________________________________    LABS (pertinent positives/negatives)  Labs Reviewed  CBC - Abnormal; Notable for the following:    RDW 15.2 (*)    All other components within normal limits  URINALYSIS COMPLETEWITH MICROSCOPIC ([facility] ONLY) - Abnormal; Notable for the following:    Color, Urine YELLOW (*)    APPearance HAZY (*)    Hgb urine dipstick 3+ (*)    Protein, ur 30 (*)    Leukocytes, UA 3+ (*)    Bacteria, UA RARE (*)    Squamous Epithelial / LPF 0-5 (*)    All other components within normal limits  LIPASE, BLOOD  COMPREHENSIVE METABOLIC PANEL  POC URINE PREG, ED  POCT PREGNANCY, URINE    ____________________________________________   EKG  None  ____________________________________________    RADIOLOGY I have personally reviewed any xrays that were ordered on this patient: None  ____________________________________________   PROCEDURES  Procedure(s) performed: none  Critical Care performed: none  ____________________________________________   INITIAL IMPRESSION / ASSESSMENT AND PLAN / ED COURSE  Pertinent labs & imaging results that were available during my care of the patient were reviewed by me and considered in my medical decision making (see chart for details).  Patient presents with mild discomfort in her low back bilaterally and lower abdominal discomfort. Her urine shows signs of the significant urinary tract infection. I suspect this is causing her pain. She is afebrile and her white blood cell count is normal. I do not think this is pyelonephritis, regardless we will give a dose of IV Rocephin.  Patient requested IM Rocephin  We will discharge her with by mouth ciprofloxacin, she knows to return if any worsening of her symptoms or fever chills  nausea or vomiting  ____________________________________________   FINAL CLINICAL IMPRESSION(S) / ED DIAGNOSES  Final diagnoses:  UTI (lower urinary tract infection)     Jene Every, MD 05/14/15 2235

## 2015-05-14 NOTE — Discharge Instructions (Signed)

## 2015-05-14 NOTE — ED Notes (Signed)
Patient reports woke this morning with lower back pain that radiates around into the sides and lower abdomen.  +nausea. Denies urinary symptoms.

## 2015-09-01 ENCOUNTER — Encounter: Payer: Self-pay | Admitting: Emergency Medicine

## 2015-09-01 ENCOUNTER — Emergency Department
Admission: EM | Admit: 2015-09-01 | Discharge: 2015-09-02 | Disposition: A | Discharge status: Home / Self Care | Payer: Federal, State, Local not specified - PPO | Attending: Emergency Medicine | Admitting: Emergency Medicine

## 2015-09-01 DIAGNOSIS — Z79899 Other long term (current) drug therapy: Secondary | ICD-10-CM | POA: Diagnosis not present

## 2015-09-01 DIAGNOSIS — Z792 Long term (current) use of antibiotics: Secondary | ICD-10-CM | POA: Insufficient documentation

## 2015-09-01 DIAGNOSIS — R509 Fever, unspecified: Secondary | ICD-10-CM | POA: Diagnosis present

## 2015-09-01 DIAGNOSIS — J069 Acute upper respiratory infection, unspecified: Secondary | ICD-10-CM | POA: Diagnosis not present

## 2015-09-01 LAB — RAPID INFLUENZA A&B ANTIGENS
Influenza A (ARMC): NOT DETECTED
Influenza B (ARMC): NOT DETECTED

## 2015-09-01 MED ORDER — ACETAMINOPHEN 500 MG PO TABS
1000.0000 mg | ORAL_TABLET | Freq: Once | ORAL | Status: AC
  Administered 2015-09-01: 1000 mg via ORAL
  Filled 2015-09-01: qty 2

## 2015-09-01 MED ORDER — BENZONATATE 100 MG PO CAPS: 100.0000 mg | ORAL_CAPSULE | Freq: Three times a day (TID) | ORAL | Status: DC | PRN

## 2015-09-01 MED ORDER — ALBUTEROL SULFATE HFA 108 (90 BASE) MCG/ACT IN AERS: 2.0000 | INHALATION_SPRAY | Freq: Four times a day (QID) | RESPIRATORY_TRACT | Status: DC | PRN

## 2015-09-01 MED ORDER — LORATADINE-PSEUDOEPHEDRINE ER 5-120 MG PO TB12: 1.0000 | ORAL_TABLET | Freq: Two times a day (BID) | ORAL | Status: DC

## 2015-09-01 NOTE — ED Provider Notes (Signed)
[facility] Emergency Department Provider Note ____________________________________________  Time seen: 2300  I have reviewed the triage vital signs and the nursing notes.  HISTORY  Chief Complaint  Fever; Cough; and Generalized Body Aches  HPI Krista Drake is a 25 y.o. female presents to the ED for evaluation of 2 days complaint of body aches, subjective fever, and cough. She claims that she may have the flu. She has not taken any medication for her fevers but has dosed Alka-Seltzer for symptom relief.She describes a persistent cough and chest tightness.   Past Medical History  Diagnosis Date  . Migraine   . Headache   . Endometriosis     Patient Active Problem List   Diagnosis Date Noted  . Acute tonsillitis 03/30/2015  . Appendicitis, acute 01/17/2015  . Abdominal pain, unspecified site 06/18/2013  . Hx of migraine headaches 06/18/2013    Past Surgical History  Procedure Laterality Date  . Wisdom tooth extraction    . Laparoscopic appendectomy N/A 01/17/2015    Procedure: APPENDECTOMY LAPAROSCOPIC;  Surgeon: Tiney Rouge III, MD;  Location: [facility] ORS;  Service: General;  Laterality: N/A;    Current Outpatient Rx  Name  Route  Sig  Dispense  Refill  . albuterol (PROVENTIL HFA;VENTOLIN HFA) 108 (90 Base) MCG/ACT inhaler   Inhalation   Inhale 2 puffs into the lungs every 6 (six) hours as needed for wheezing or shortness of breath.   1 Inhaler   0   . amoxicillin-clavulanate (AUGMENTIN) 400-57 MG/5ML suspension      2 1/4 teaspoons by mouth twice daily for 10 days   250 mL   0   . benzonatate (TESSALON PERLES) 100 MG capsule   Oral   Take 1 capsule (100 mg total) by mouth 3 (three) times daily as needed for cough (Take 1-2 per dose).   30 capsule   0   . ciprofloxacin (CIPRO) 500 MG tablet   Oral   Take 1 tablet (500 mg total) by mouth 2 (two) times daily.   14 tablet   0   . HYDROcodone-acetaminophen (HYCET) 7.5-325 mg/15 ml  solution      10-15 cc PO every 4-6 hours as needed for pain   300 mL   0   . loratadine-pseudoephedrine (CLARITIN-D 12 HOUR) 5-120 MG tablet   Oral   Take 1 tablet by mouth 2 (two) times daily.   30 tablet   0   . prednisoLONE (PRELONE) 15 MG/5ML SOLN      10 cc PO BID x 3 days, 5 cc PO BID x 3 days, 5 cc PO QD x 3 days   1 Bottle   0    Allergies Caramel and Zoloft  Family History  Problem Relation Age of Onset  . Diabetes Maternal Grandmother   . Diabetes Maternal Grandfather   . Cancer Paternal Grandmother     breast  . Anemia Mother    Social History Social History  Substance Use Topics  . Smoking status: Never Smoker   . Smokeless tobacco: Never Used  . Alcohol Use: No   Review of Systems  Constitutional: Reports subjective fevers. Eyes: Negative for visual changes. ENT: Negative for sore throat. Cardiovascular: Negative for chest pain. Respiratory: Negative for shortness of breath. Reports cough Gastrointestinal: Negative for abdominal pain, vomiting and diarrhea. Musculoskeletal: Negative for back pain. Skin: Negative for rash. Neurological: Negative for headaches, focal weakness or numbness. ____________________________________________  PHYSICAL EXAM:  VITAL SIGNS: ED Triage Vitals  Enc Vitals Group     BP 09/01/15 2131 132/80 mmHg     Pulse Rate 09/01/15 2131 79     Resp 09/01/15 2131 20     Temp 09/01/15 2131 98.6 F (37 C)     Temp Source 09/01/15 2131 Oral     SpO2 09/01/15 2131 100 %     Weight 09/01/15 2131 220 lb (99.791 kg)     Height 09/01/15 2131  (1.676 m)     Head Cir --      Peak Flow --      Pain Score 09/01/15 2131 10     Pain Loc --      Pain Edu? --      Excl. in GC? --    Constitutional: Alert and oriented. Well appearing and in no distress. Head: Normocephalic and atraumatic.      Eyes: Conjunctivae are normal. PERRL. Normal extraocular movements      Ears: Canals clear. TMs intact bilaterally.   Nose: No  congestion/rhinorrhea.   Mouth/Throat: Mucous membranes are moist.   Neck: Supple. No thyromegaly. Hematological/Lymphatic/Immunological: No cervical lymphadenopathy. Cardiovascular: Normal rate, regular rhythm.  Respiratory: Normal respiratory effort. No wheezes/rales/rhonchi. Gastrointestinal: Soft and nontender. No distention. Musculoskeletal: Nontender with normal range of motion in all extremities.  Neurologic:  Normal gait without ataxia. Normal speech and language. No gross focal neurologic deficits are appreciated. Skin:  Skin is warm, dry and intact. No rash noted. Psychiatric: Mood and affect are normal. Patient exhibits appropriate insight and judgment. ____________________________________________   LABS (pertinent positives/negatives) Labs Reviewed  RAPID INFLUENZA A&B ANTIGENS ([facility] ONLY)  ____________________________________________  PROCEDURES  Tylenol 1000 mg PO ____________________________________________  INITIAL IMPRESSION / ASSESSMENT AND PLAN / ED COURSE  Patient with upper respiratory infection without indication of influenza A or B infection. She seems disappointed at the fact that her flu test was negative. However, I reassured her that thankfully she does not have influenza. She is to treat her likely viral upper a story infection with a prescription provided for albuterol, Tessalon Perles, and Claritin-D. She is also advised to consider starting an over-the-counter cough medicine like Delsym for symptom relief. She is to monitor symptoms and follow-up the [facility] for ongoing symptom management or worsening symptoms. ____________________________________________  FINAL CLINICAL IMPRESSION(S) / ED DIAGNOSES  Final diagnoses:  URI (upper respiratory infection)      Lissa Hoard, PA-C 09/01/15 2358  Governor Rooks, MD 09/04/15 [phone]

## 2015-09-01 NOTE — Discharge Instructions (Signed)
Cool Mist Vaporizers Vaporizers may help relieve the symptoms of a cough and cold. They add moisture to the air, which helps mucus to become thinner and less sticky. This makes it easier to breathe and cough up secretions. Cool mist vaporizers do not cause serious burns like hot mist vaporizers, which may also be called steamers or humidifiers. Vaporizers have not been proven to help with colds. You should not use a vaporizer if you are allergic to mold. HOME CARE INSTRUCTIONS  Follow the package instructions for the vaporizer.  Do not use anything other than distilled water in the vaporizer.  Do not run the vaporizer all of the time. This can cause mold or bacteria to grow in the vaporizer.  Clean the vaporizer after each time it is used.  Clean and dry the vaporizer well before storing it.  Stop using the vaporizer if worsening respiratory symptoms develop.   This information is not intended to replace advice given to you by your health care provider. Make sure you discuss any questions you have with your health care provider.   Document Released: 03/28/2004 Document Revised: 07/06/2013 Document Reviewed: 11/18/2012 Elsevier Interactive Patient Education 2016 Elsevier Inc.  Cough, Adult A cough helps to clear your throat and lungs. A cough may last only 2-3 weeks (acute), or it may last longer than 8 weeks (chronic). Many different things can cause a cough. A cough may be a sign of an illness or another medical condition. HOME CARE  Pay attention to any changes in your cough.  Take medicines only as told by your doctor.  If you were prescribed an antibiotic medicine, take it as told by your doctor. Do not stop taking it even if you start to feel better.  Talk with your doctor before you try using a cough medicine.  Drink enough fluid to keep your pee (urine) clear or pale yellow.  If the air is dry, use a cold steam vaporizer or humidifier in your home.  Stay away from things  that make you cough at work or at home.  If your cough is worse at night, try using extra pillows to raise your head up higher while you sleep.  Do not smoke, and try not to be around smoke. If you need help quitting, ask your doctor.  Do not have caffeine.  Do not drink alcohol.  Rest as needed. GET HELP IF:  You have new problems (symptoms).  You cough up yellow fluid (pus).  Your cough does not get better after 2-3 weeks, or your cough gets worse.  Medicine does not help your cough and you are not sleeping well.  You have pain that gets worse or pain that is not helped with medicine.  You have a fever.  You are losing weight and you do not know why.  You have night sweats. GET HELP RIGHT AWAY IF:  You cough up blood.  You have trouble breathing.  Your heartbeat is very fast.   This information is not intended to replace advice given to you by your health care provider. Make sure you discuss any questions you have with your health care provider.   Document Released: 03/14/2011 Document Revised: 03/22/2015 Document Reviewed: 09/07/2014 Elsevier Interactive Patient Education 2016 Elsevier Inc.  Upper Respiratory Infection, Adult Most upper respiratory infections (URIs) are caused by a virus. A URI affects the nose, throat, and upper air passages. The most common type of URI is often called "the common cold." HOME CARE  Take medicines only as told by your doctor.  Gargle warm saltwater or take cough drops to comfort your throat as told by your doctor.  Use a warm mist humidifier or inhale steam from a shower to increase air moisture. This may make it easier to breathe.  Drink enough fluid to keep your pee (urine) clear or pale yellow.  Eat soups and other clear broths.  Have a healthy diet.  Rest as needed.  Go back to work when your fever is gone or your doctor says it is okay.  You may need to stay home longer to avoid giving your URI to others.  You  can also wear a face mask and wash your hands often to prevent spread of the virus.  Use your inhaler more if you have asthma.  Do not use any tobacco products, including cigarettes, chewing tobacco, or electronic cigarettes. If you need help quitting, ask your doctor. GET HELP IF:  You are getting worse, not better.  Your symptoms are not helped by medicine.  You have chills.  You are getting more short of breath.  You have brown or red mucus.  You have yellow or brown discharge from your nose.  You have pain in your face, especially when you bend forward.  You have a fever.  You have puffy (swollen) neck glands.  You have pain while swallowing.  You have white areas in the back of your throat. GET HELP RIGHT AWAY IF:   You have very bad or constant:  Headache.  Ear pain.  Pain in your forehead, behind your eyes, and over your cheekbones (sinus pain).  Chest pain.  You have long-lasting (chronic) lung disease and any of the following:  Wheezing.  Long-lasting cough.  Coughing up blood.  A change in your usual mucus.  You have a stiff neck.  You have changes in your:  Vision.  Hearing.  Thinking.  Mood. MAKE SURE YOU:   Understand these instructions.  Will watch your condition.  Will get help right away if you are not doing well or get worse.   This information is not intended to replace advice given to you by your health care provider. Make sure you discuss any questions you have with your health care provider.   Document Released: 12/18/2007 Document Revised: 11/15/2014 Document Reviewed: 10/06/2013 Elsevier Interactive Patient Education [employer]  Your flu test was negative. Take the prescription meds as directed. Consider starting Delsym cough syrup OTC. Follow-up with [facility] for continued symptoms.

## 2015-09-01 NOTE — ED Notes (Signed)
Mask placed on patient in triage  

## 2015-09-01 NOTE — ED Notes (Signed)
Pt states body aches, fever, cough, that began two days ago. Pt states "i think i have the flu". Pt states has not taken tylenol or motrin for fever. Pt denies vomiting, states "i just don't have an appetitie".

## 2015-10-06 ENCOUNTER — Encounter: Payer: Self-pay | Admitting: Emergency Medicine

## 2015-10-06 ENCOUNTER — Emergency Department
Admission: EM | Admit: 2015-10-06 | Discharge: 2015-10-06 | Disposition: A | Discharge status: Home / Self Care | Payer: Federal, State, Local not specified - PPO | Attending: Emergency Medicine | Admitting: Emergency Medicine

## 2015-10-06 DIAGNOSIS — Z79899 Other long term (current) drug therapy: Secondary | ICD-10-CM | POA: Insufficient documentation

## 2015-10-06 DIAGNOSIS — R509 Fever, unspecified: Secondary | ICD-10-CM | POA: Diagnosis present

## 2015-10-06 DIAGNOSIS — J020 Streptococcal pharyngitis: Secondary | ICD-10-CM | POA: Insufficient documentation

## 2015-10-06 LAB — POCT RAPID STREP A: STREPTOCOCCUS, GROUP A SCREEN (DIRECT): POSITIVE — AB

## 2015-10-06 MED ORDER — HYDROCODONE-ACETAMINOPHEN 7.5-325 MG/15ML PO SOLN: ORAL | Status: DC

## 2015-10-06 MED ORDER — ACETAMINOPHEN 160 MG/5ML PO SUSP
ORAL | Status: AC
  Administered 2015-10-06: 960 mg
  Filled 2015-10-06: qty 30

## 2015-10-06 MED ORDER — PREDNISOLONE 15 MG/5ML PO SOLN
30.0000 mg | Freq: Every day | ORAL | Status: DC
Start: 2015-10-06 — End: 2015-11-10

## 2015-10-06 MED ORDER — ACETAMINOPHEN 500 MG PO TABS: 1000.0000 mg | ORAL_TABLET | Freq: Once | ORAL | Status: DC

## 2015-10-06 MED ORDER — SODIUM CHLORIDE 0.9 % IV SOLN
3.0000 g | Freq: Once | INTRAVENOUS | Status: AC
  Administered 2015-10-06: 3 g via INTRAVENOUS
  Filled 2015-10-06: qty 3

## 2015-10-06 MED ORDER — AMOXICILLIN-POT CLAVULANATE 400-57 MG/5ML PO SUSR: ORAL | Status: DC

## 2015-10-06 MED ORDER — DEXAMETHASONE SODIUM PHOSPHATE 10 MG/ML IJ SOLN
10.0000 mg | Freq: Once | INTRAMUSCULAR | Status: AC
  Administered 2015-10-06: 10 mg via INTRAVENOUS
  Filled 2015-10-06: qty 1

## 2015-10-06 NOTE — ED Provider Notes (Signed)
[facility] Emergency Department Provider Note  ____________________________________________  Time seen: Approximately 8:42 AM  I have reviewed the triage vital signs and the nursing notes.   HISTORY  Chief Complaint No chief complaint on file. Sore throat.   HPI Krista Drake is a 25 y.o. female is here with complaint of body aches, sore throat and fever since yesterday. Today patient has had increased pain with swallowing. She has been seen in the past multiple times for strep throat. She states the last time was with Dr. Jenne Campus in October when he told her that she needed to have a tonsillectomy. At that time patient refused. Patient has been able to take Tylenol for her fever. She denies any nausea or vomiting. She is able to swallow her own saliva. Currently pain is 10 over 10.   Past Medical History  Diagnosis Date  . Migraine   . Headache   . Endometriosis     Patient Active Problem List   Diagnosis Date Noted  . Acute tonsillitis 03/30/2015  . Appendicitis, acute 01/17/2015  . Abdominal pain, unspecified site 06/18/2013  . Hx of migraine headaches 06/18/2013    Past Surgical History  Procedure Laterality Date  . Wisdom tooth extraction    . Laparoscopic appendectomy N/A 01/17/2015    Procedure: APPENDECTOMY LAPAROSCOPIC;  Surgeon: Tiney Rouge III, MD;  Location: [facility] ORS;  Service: General;  Laterality: N/A;    Current Outpatient Rx  Name  Route  Sig  Dispense  Refill  . albuterol (PROVENTIL HFA;VENTOLIN HFA) 108 (90 Base) MCG/ACT inhaler   Inhalation   Inhale 2 puffs into the lungs every 6 (six) hours as needed for wheezing or shortness of breath.   1 Inhaler   0   . amoxicillin-clavulanate (AUGMENTIN) 400-57 MG/5ML suspension      2 1/4 teaspsoon bid x 10 days   100 mL   0   . benzonatate (TESSALON PERLES) 100 MG capsule   Oral   Take 1 capsule (100 mg total) by mouth 3 (three) times daily as needed for cough (Take 1-2 per  dose).   30 capsule   0   . HYDROcodone-acetaminophen (HYCET) 7.5-325 mg/15 ml solution      10-15 cc PO every 4-6 hours as needed for pain   300 mL   0   . loratadine-pseudoephedrine (CLARITIN-D 12 HOUR) 5-120 MG tablet   Oral   Take 1 tablet by mouth 2 (two) times daily.   30 tablet   0   . prednisoLONE (PRELONE) 15 MG/5ML SOLN   Oral   Take 10 mLs (30 mg total) by mouth daily before breakfast.   30 mL   0     Allergies Caramel and Zoloft  Family History  Problem Relation Age of Onset  . Diabetes Maternal Grandmother   . Diabetes Maternal Grandfather   . Cancer Paternal Grandmother     breast  . Anemia Mother     Social History Social History  Substance Use Topics  . Smoking status: Never Smoker   . Smokeless tobacco: Never Used  . Alcohol Use: No    Review of Systems Constitutional: Positive fever/chills ENT: Positive sore throat. Cardiovascular: Denies chest pain. Respiratory: Denies shortness of breath. Gastrointestinal:  No nausea, no vomiting. Musculoskeletal: Negative for back pain. Skin: Negative for rash. Neurological: Negative for headaches, focal weakness or numbness.  10-point ROS otherwise negative.  ____________________________________________   PHYSICAL EXAM:  VITAL SIGNS: ED Triage Vitals  Enc Vitals  Group     BP 10/06/15 0833 126/79 mmHg     Pulse Rate 10/06/15 0833 123     Resp 10/06/15 0833 18     Temp 10/06/15 0833 103.2 F (39.6 C)     Temp Source 10/06/15 0833 Oral     SpO2 10/06/15 0833 100 %     Weight 10/06/15 0833 215 lb (97.523 kg)     Height 10/06/15 0833 5\' 6"  (1.676 m)     Head Cir --      Peak Flow --      Pain Score 10/06/15 0835 10     Pain Loc --      Pain Edu? --      Excl. in GC? --     Constitutional: Alert and oriented. Well appearing and in no acute distress. Eyes: Conjunctivae are normal. PERRL. EOMI. Head: Atraumatic. Nose: No congestion/rhinnorhea. Mouth/Throat: Mucous membranes are moist.   Oropharynx Erythematous with enlarged tonsils. No exudate was seen at this time however tonsils are slightly kissing. Patient has muffled voice but is able to swallow saliva. Neck: No stridor.   Hematological/Lymphatic/Immunilogical: Bilateral cervical lymphadenopathy. Cardiovascular: Normal rate, regular rhythm. Grossly normal heart sounds.  Good peripheral circulation. Respiratory: Normal respiratory effort.  No retractions. Lungs CTAB. Musculoskeletal: Moves upper and lower 70s without any difficulty. Neurologic: Muffled speech and language. No gross focal neurologic deficits are appreciated. No gait instability. Skin:  Skin is warm, dry and intact. No rash noted. Psychiatric: Mood and affect are normal. Speech and behavior are normal.  ____________________________________________   LABS (all labs ordered are listed, but only abnormal results are displayed)  Labs Reviewed  POCT RAPID STREP A - Abnormal; Notable for the following:    Streptococcus, Group A Screen (Direct) POSITIVE (*)    All other components within normal limits    PROCEDURES  Procedure(s) performed: None  Critical Care performed: No  ____________________________________________   INITIAL IMPRESSION / ASSESSMENT AND PLAN / ED COURSE  Pertinent labs & imaging results that were available during my care of the patient were reviewed by me and considered in my medical decision making (see chart for details).  Patient was given Unasyn IV in the emergency room along with Decadron IV. Patient was able to take Tylenol for fever while in the emergency room. Patient agrees to make an appointment with [facility] for discussion of a tonsillectomy. Patient was placed on Augmentin suspension, hydrocodone solution, and prelone solution.    ____________________________________________   FINAL CLINICAL IMPRESSION(S) / ED DIAGNOSES  Final diagnoses:  Acute streptococcal pharyngitis      Tommi RumpsRhonda L Francesco Provencal,  PA-C 10/06/15 1236  Jeanmarie PlantJames A McShane, MD 10/06/15 1354

## 2015-10-06 NOTE — Discharge Instructions (Signed)
Begin taking medication today. Augmentin 2-1/4 teaspoon twice a day for 10 days. Hydrocodone acetaminophen as directed or pain. Prelone as directed for inflammation and swelling. Make an appointment with [facility] to discuss tonsillectomy as Dr. Jenne CampusMcQueen has already mentioned.

## 2015-10-06 NOTE — ED Notes (Signed)
States she developed body aches  Sore throat and fever since yesterday  Increased pain with swallowing

## 2015-11-09 IMAGING — CT CT ABD-PELV W/ CM
2 of 3 series · 15 of 42 positions shown, 19 images · IV contrast (omnipaque)
Comparison: CT abdomen and pelvis October 30, 2014

CLINICAL DATA: RIGHT flank and RIGHT lower quadrant pain beginning
at 1 p.m. yesterday. Treated for urinary tract infection 1 week ago.
Non bloody emesis. History of endometriosis and exploratory
laparotomy.

EXAM:
CT ABDOMEN AND PELVIS WITH CONTRAST
TECHNIQUE: Multidetector CT imaging of the abdomen and pelvis was performed
using the standard protocol following bolus administration of
intravenous contrast.
CONTRAST:  100mL OMNIPAQUE IOHEXOL 300 MG/ML  SOLN

[Series 2: routine abd pel with · axial · 0.75mm/px · z∈[-426,-2]mm · 12 of 97 slices shown, 16 images]
[im 8/97  soft-tissue]
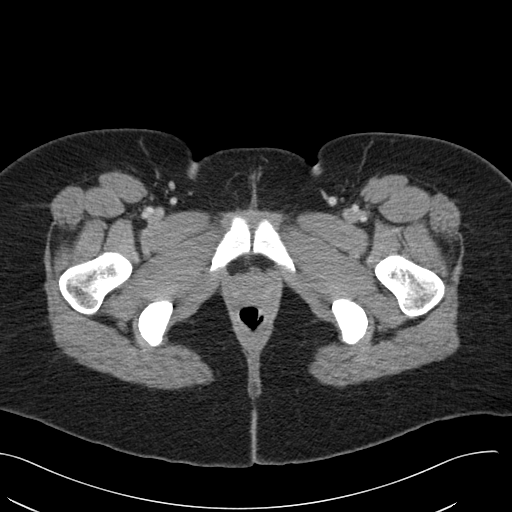
[im 8/97  bone]
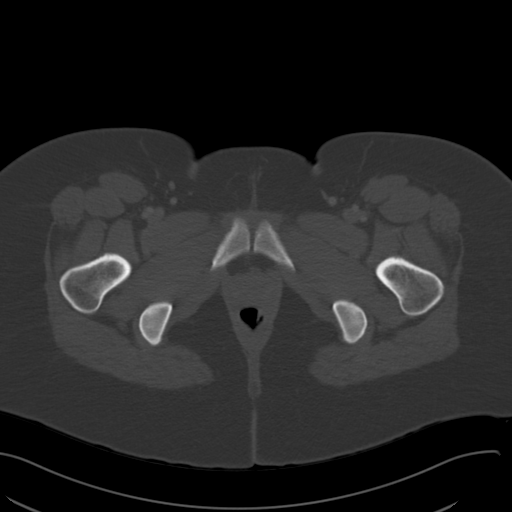
[im 16/97  soft-tissue]
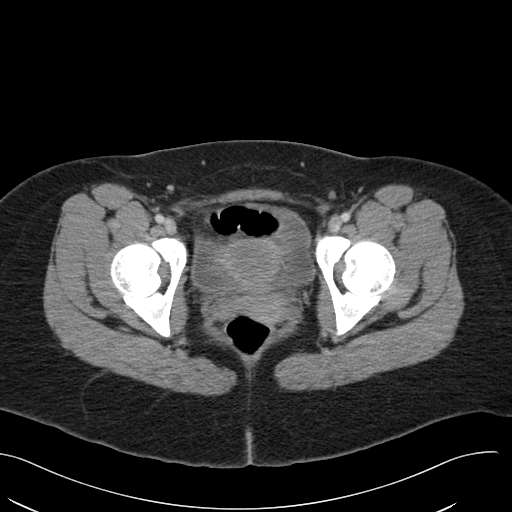
[im 27/97  soft-tissue]
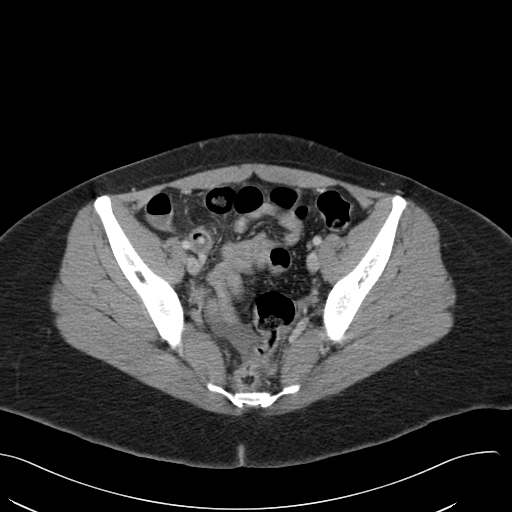
[im 35/97  soft-tissue]
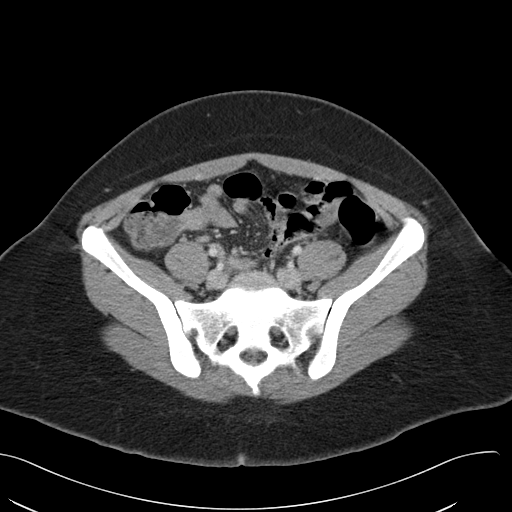
[im 43/97  soft-tissue]
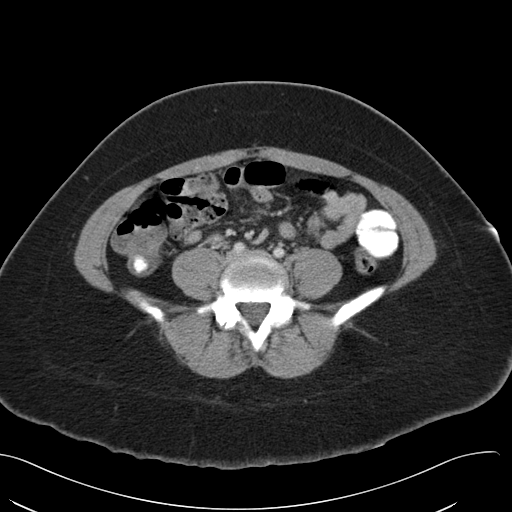
[im 54/97  soft-tissue]
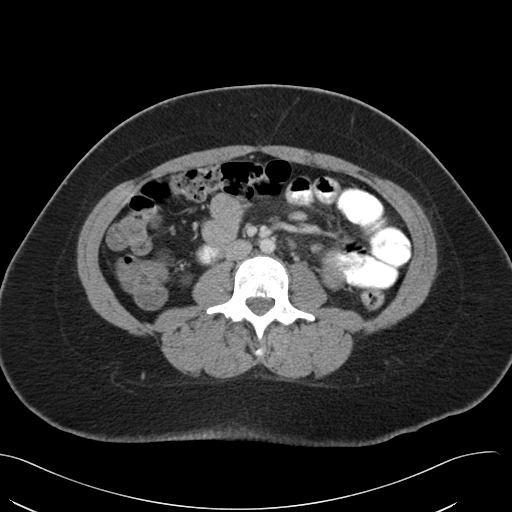
[im 62/97  soft-tissue]
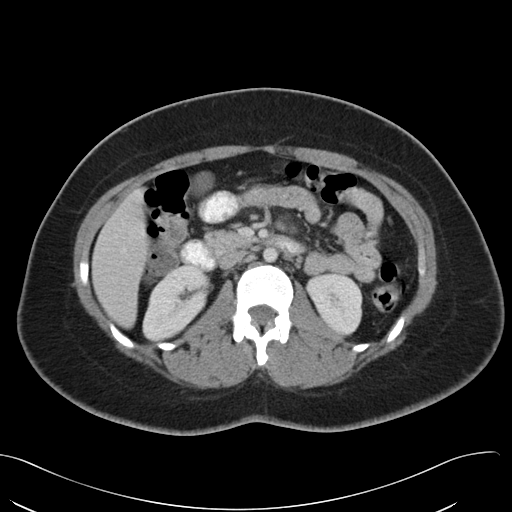
[im 73/97  soft-tissue]
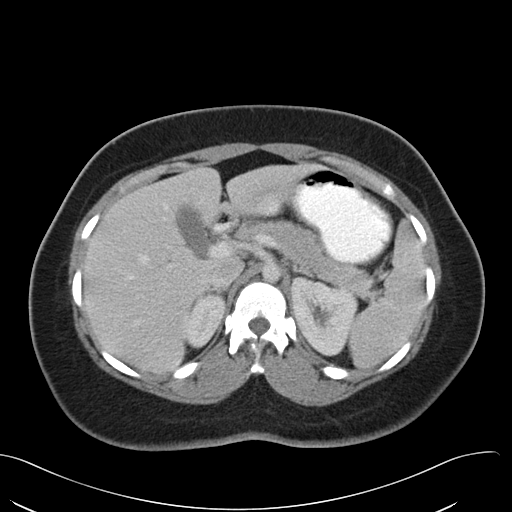
[im 81/97  soft-tissue]
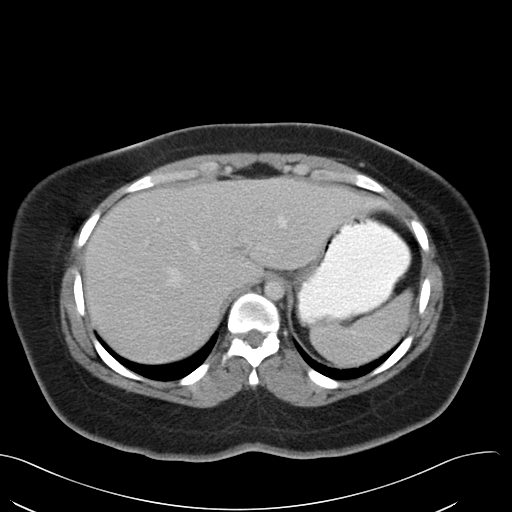
[im 81/97  lung]
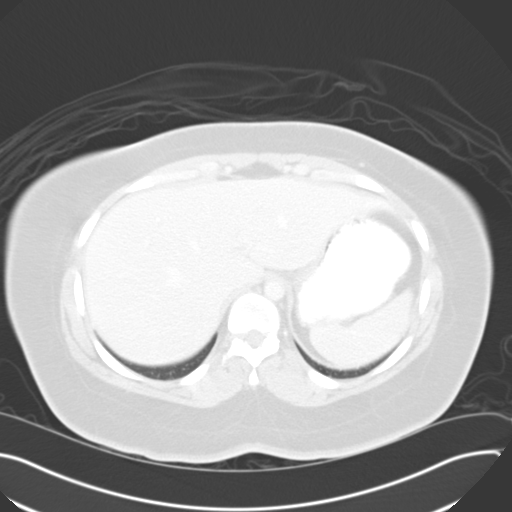
[im 81/97  bone]
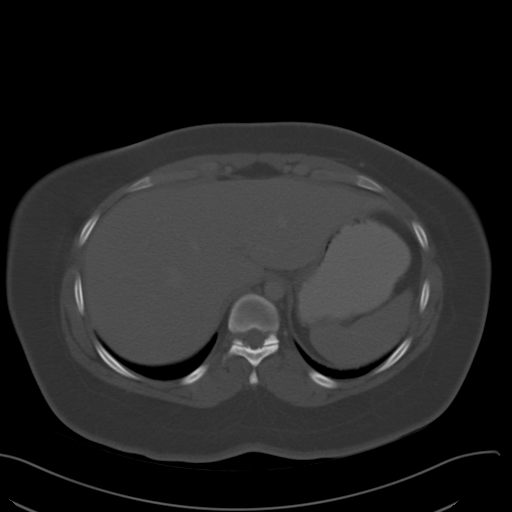
[im 85/97  lung]
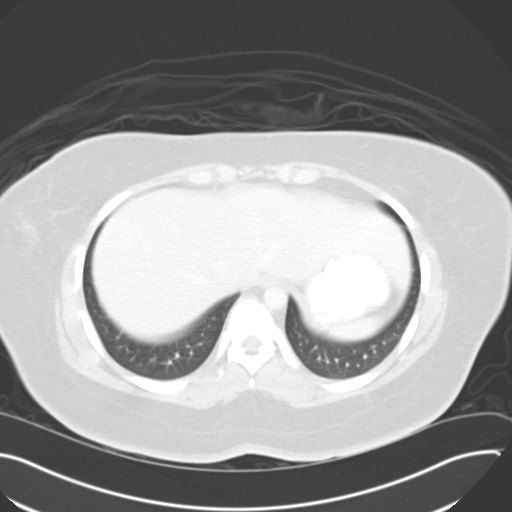
[im 89/97  soft-tissue]
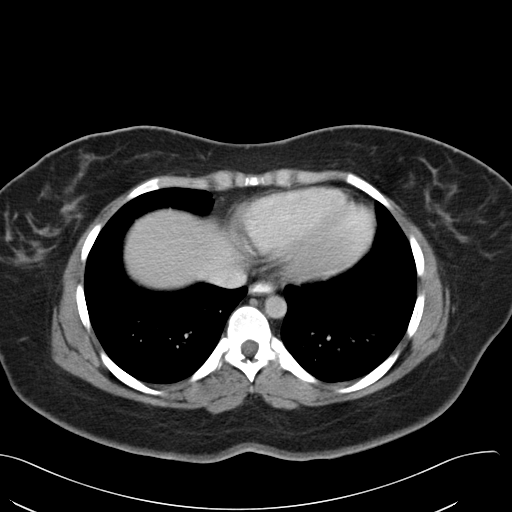
[im 89/97  lung]
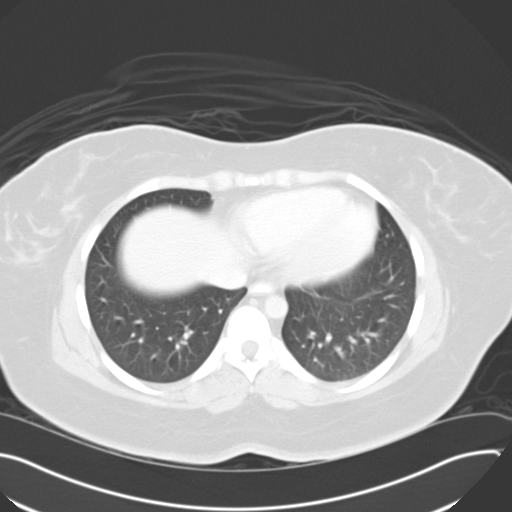
[im 93/97  lung]
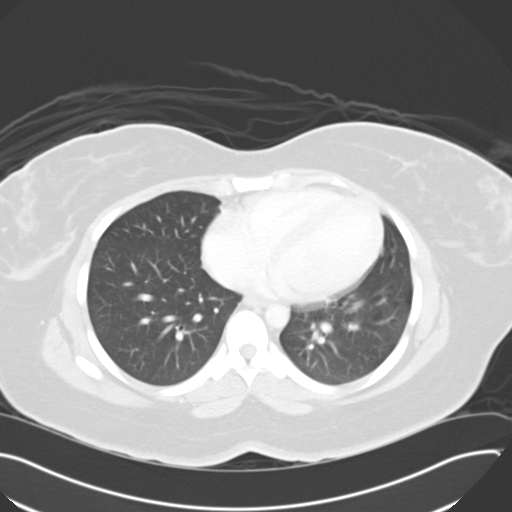

[Series 5: cor routine abd pel with · coronal · 1.01mm/px · 3 of 134 slices shown]
[im 45/134  soft-tissue]
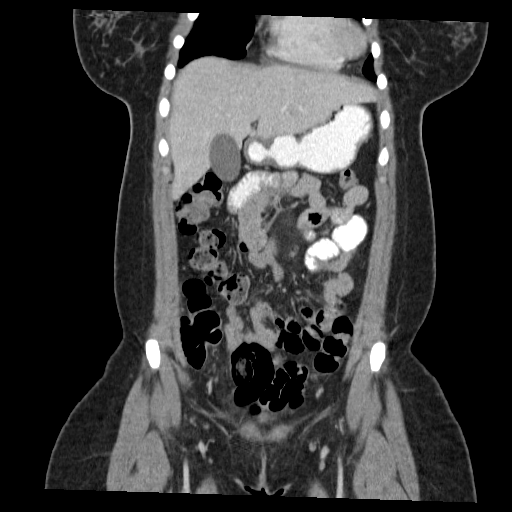
[im 60/134  soft-tissue]
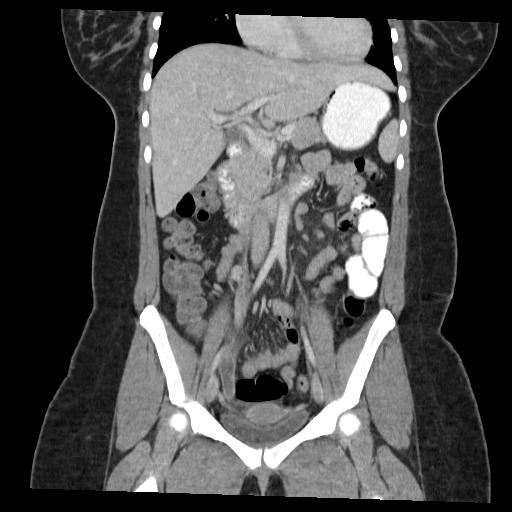
[im 74/134  soft-tissue]
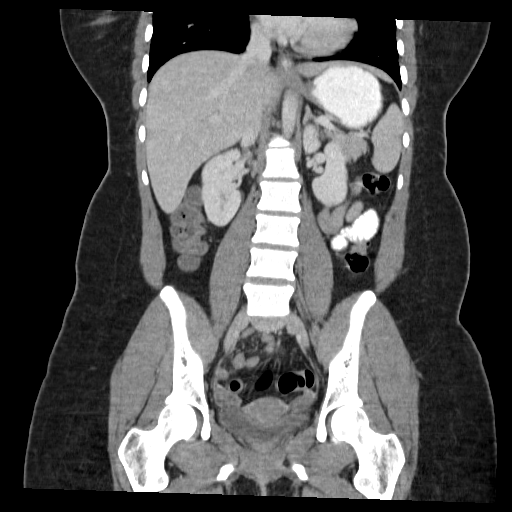

[15 of 42 positions shown; findings below may reference images not displayed]

FINDINGS: LUNG BASES: Included view of the lung bases are clear. Visualized
heart and pericardium are unremarkable.

SOLID ORGANS: The liver, spleen, gallbladder, pancreas and adrenal
glands are unremarkable.

GASTROINTESTINAL TRACT: The stomach, small and large bowel are
normal in course and caliber without inflammatory changes. Enteric
contrast has not yet reached the distal small bowel. The appendix is
enlarged, 15 mm, 8 mm appendicolith, mild periappendiceal
inflammatory changes and proximal appendix wall thickening with
hyperemia.

KIDNEYS/ URINARY TRACT: Kidneys are orthotopic, demonstrating
symmetric enhancement. No nephrolithiasis, hydronephrosis or solid
renal masses. The unopacified ureters are normal in course and
caliber. Urinary bladder is partially distended and unremarkable.

PERITONEUM/RETROPERITONEUM: Aortoiliac vessels are normal in course
and caliber. Mild mesenteric lymphadenopathy is likely reactive.
Internal reproductive organs are unremarkable. Small amount of free
fluid in the pelvis may be physiologic or reactive.

SOFT TISSUE/OSSEOUS STRUCTURES: Non-suspicious. Mild thoracolumbar
levoscoliosis.
IMPRESSION: Acute uncomplicated appendicitis.

Small amount of free fluid in the pelvis may be physiologic or
reactive. Mild mesenteric lymphadenopathy is likely reactive.

No urolithiasis or obstructive uropathy.

Acute findings discussed with and reconfirmed by Dr.ABDOUL KARIMOS GAANBIYE
on 01/17/2015 at [DATE].

## 2015-11-10 ENCOUNTER — Encounter: Payer: Self-pay | Admitting: *Deleted

## 2015-11-13 NOTE — Discharge Instructions (Signed)
T & A INSTRUCTION SHEET - [facility] °[facility] ° °CREIGHTON VAUGHT, MD °PAUL H. JUENGEL, MD  °P. SCOTT BENNETT °CHAPMAN MCQUEEN, MD ° °[address] [place], [place] [address] TEL. [phone] °[address] [place] [address] [phone] ° °INFORMATION SHEET FOR A TONSILLECTOMY AND ADENDOIDECTOMY ° °About Your Tonsils and Adenoids ° The tonsils and adenoids are normal body tissues that are part of our immune system.  They normally help to protect us against diseases that may enter our mouth and nose.  However, sometimes the tonsils and/or adenoids become too large and obstruct our breathing, especially at night. °  ° If either of these things happen it helps to remove the tonsils and adenoids in order to become healthier. The operation to remove the tonsils and adenoids is called a tonsillectomy and adenoidectomy. ° °The Location of Your Tonsils and Adenoids ° The tonsils are located in the back of the throat on both side and sit in a cradle of muscles. The adenoids are located in the roof of the mouth, behind the nose, and closely associated with the opening of the Eustachian tube to the ear. ° °Surgery on Tonsils and Adenoids ° A tonsillectomy and adenoidectomy is a short operation which takes about thirty minutes.  This includes being put to sleep and being awakened.  Tonsillectomies and adenoidectomies are performed at [facility] and may require observation period in the recovery room prior to going home. ° °Following the Operation for a Tonsillectomy ° A cautery machine is used to control bleeding.  Bleeding from a tonsillectomy and adenoidectomy is minimal and postoperatively the risk of bleeding is approximately four percent, although this rarely life threatening. ° ° ° °After your tonsillectomy and adenoidectomy post-op care at home: ° °1. Our patients are able to go home the same day.  You may be given prescriptions for pain  medications and antibiotics, if indicated. °2. It is extremely important to remember that fluid intake is of utmost importance after a tonsillectomy.  The amount that you drink must be maintained in the postoperative period.  A good indication of whether a child is getting enough fluid is whether his/her urine output is constant.  As long as children are urinating or wetting their diaper every 6 - 8 hours this is usually enough fluid intake.   °3. Although rare, this is a risk of some bleeding in the first ten days after surgery.  This is usually occurs between day five and nine postoperatively.  This risk of bleeding is approximately four percent.  If you or your child should have any bleeding you should remain calm and notify our office or go directly to the Emergency Room at [facility] where they will contact us. Our doctors are available seven days a week for notification.  We recommend sitting up quietly in a chair, place an ice pack on the front of the neck and spitting out the blood gently until we are able to contact you.  Adults should gargle gently with ice water and this may help stop the bleeding.  If the bleeding does not stop after a short time, i.e. 10 to 15 minutes, or seems to be increasing again, please contact us or go to the hospital.   °4. It is common for the pain to be worse at 5 - 7 days postoperatively.  This occurs because the “scab” is peeling off and the mucous membrane (skin of   the throat) is growing back where the tonsils were.   °5. It is common for a low-grade fever, less than 102, during the first week after a tonsillectomy and adenoidectomy.  It is usually due to not drinking enough liquids, and we suggest your use liquid Tylenol or the pain medicine with Tylenol prescribed in order to keep your temperature below 102.  Please follow the directions on the back of the bottle. °6. Do not take aspirin or any products that contain aspirin such as Bufferin, Anacin,  Ecotrin, aspirin gum, Goodies, BC headache powders, etc., after a T&A because it can promote bleeding.  Please check with our office before administering any other medication that may been prescribed by other doctors during the two week post-operative period. °7. If you happen to look in the mirror or into your child’s mouth you will see white/gray patches on the back of the throat.  This is what a scab looks like in the mouth and is normal after having a T&A.  It will disappear once the tonsil area heals completely. However, it may cause a noticeable odor, and this too will disappear with time.     °8. You or your child may experience ear pain after having a T&A.  This is called referred pain and comes from the throat, but it is felt in the ears.  Ear pain is quite common and expected.  It will usually go away after ten days.  There is usually nothing wrong with the ears, and it is primarily due to the healing area stimulating the nerve to the ear that runs along the side of the throat.  Use either the prescribed pain medicine or Tylenol as needed.  °9. The throat tissues after a tonsillectomy are obviously sensitive.  Smoking around children who have had a tonsillectomy significantly increases the risk of bleeding.  DO NOT SMOKE!  ° °General Anesthesia, Adult, Care After °Refer to this sheet in the next few weeks. These instructions provide you with information on caring for yourself after your procedure. Your health care provider may also give you more specific instructions. Your treatment has been planned according to current medical practices, but problems sometimes occur. Call your health care provider if you have any problems or questions after your procedure. °WHAT TO EXPECT AFTER THE PROCEDURE °After the procedure, it is typical to experience: °· Sleepiness. °· Nausea and vomiting. °HOME CARE INSTRUCTIONS °· For the first 24 hours after general anesthesia: °¨ Have a responsible person with you. °¨ Do not  drive a car. If you are alone, do not take public transportation. °¨ Do not drink alcohol. °¨ Do not take medicine that has not been prescribed by your health care provider. °¨ Do not sign important papers or make important decisions. °¨ You may resume a normal diet and activities as directed by your health care provider. °· Change bandages (dressings) as directed. °· If you have questions or problems that seem related to general anesthesia, call the hospital and ask for the anesthetist or anesthesiologist on call. °SEEK MEDICAL CARE IF: °· You have nausea and vomiting that continue the day after anesthesia. °· You develop a rash. °SEEK IMMEDIATE MEDICAL CARE IF:  °· You have difficulty breathing. °· You have chest pain. °· You have any allergic problems. °  °This information is not intended to replace advice given to you by your health care provider. Make sure you discuss any questions you have with your health care provider. °  °Document   Released: 10/07/2000 Document Revised: 07/22/2014 Document Reviewed: 10/30/2011 °Elsevier Interactive Patient Education ©2016 [employer] ° °

## 2015-11-17 ENCOUNTER — Ambulatory Visit: Payer: Federal, State, Local not specified - PPO | Admitting: Anesthesiology

## 2015-11-17 ENCOUNTER — Ambulatory Visit
Admission: RE | Admit: 2015-11-17 | Discharge: 2015-11-17 | Disposition: A | Discharge status: Home / Self Care | Payer: Federal, State, Local not specified - PPO | Source: Ambulatory Visit | Attending: Unknown Physician Specialty | Admitting: Unknown Physician Specialty

## 2015-11-17 ENCOUNTER — Encounter
Admission: RE | Disposition: A | Discharge status: Home / Self Care | Payer: Self-pay | Source: Ambulatory Visit | Attending: Unknown Physician Specialty

## 2015-11-17 DIAGNOSIS — Z833 Family history of diabetes mellitus: Secondary | ICD-10-CM | POA: Diagnosis not present

## 2015-11-17 DIAGNOSIS — Z809 Family history of malignant neoplasm, unspecified: Secondary | ICD-10-CM | POA: Diagnosis not present

## 2015-11-17 DIAGNOSIS — G43909 Migraine, unspecified, not intractable, without status migrainosus: Secondary | ICD-10-CM | POA: Insufficient documentation

## 2015-11-17 DIAGNOSIS — J352 Hypertrophy of adenoids: Secondary | ICD-10-CM | POA: Diagnosis not present

## 2015-11-17 DIAGNOSIS — J039 Acute tonsillitis, unspecified: Secondary | ICD-10-CM | POA: Insufficient documentation

## 2015-11-17 HISTORY — PX: TONSILLECTOMY AND ADENOIDECTOMY: SHX28

## 2015-11-17 HISTORY — DX: Motion sickness, initial encounter: T75.3XXA

## 2015-11-17 HISTORY — DX: Bell's palsy: G51.0

## 2015-11-17 SURGERY — TONSILLECTOMY AND ADENOIDECTOMY
Anesthesia: General | Site: Throat | Wound class: Clean Contaminated

## 2015-11-17 MED ORDER — PROMETHAZINE HCL 25 MG/ML IJ SOLN: 6.2500 mg | INTRAMUSCULAR | Status: DC | PRN

## 2015-11-17 MED ORDER — FENTANYL CITRATE (PF) 100 MCG/2ML IJ SOLN
INTRAMUSCULAR | Status: DC | PRN
  Administered 2015-11-17: 100 ug via INTRAVENOUS

## 2015-11-17 MED ORDER — SCOPOLAMINE 1 MG/3DAYS TD PT72
1.0000 | MEDICATED_PATCH | TRANSDERMAL | Status: DC
  Administered 2015-11-17: 1.5 mg via TRANSDERMAL

## 2015-11-17 MED ORDER — ONDANSETRON HCL 4 MG/2ML IJ SOLN
INTRAMUSCULAR | Status: DC | PRN
  Administered 2015-11-17: 4 mg via INTRAVENOUS

## 2015-11-17 MED ORDER — MIDAZOLAM HCL 5 MG/5ML IJ SOLN
INTRAMUSCULAR | Status: DC | PRN
  Administered 2015-11-17: 2 mg via INTRAVENOUS

## 2015-11-17 MED ORDER — OXYCODONE HCL 5 MG/5ML PO SOLN
5.0000 mg | Freq: Once | ORAL | Status: AC | PRN
  Administered 2015-11-17: 5 mg via ORAL

## 2015-11-17 MED ORDER — ACETAMINOPHEN 10 MG/ML IV SOLN
1000.0000 mg | Freq: Once | INTRAVENOUS | Status: AC
  Administered 2015-11-17: 1000 mg via INTRAVENOUS

## 2015-11-17 MED ORDER — DEXAMETHASONE SODIUM PHOSPHATE 4 MG/ML IJ SOLN
INTRAMUSCULAR | Status: DC | PRN
  Administered 2015-11-17: 10 mg via INTRAVENOUS

## 2015-11-17 MED ORDER — HYDROMORPHONE HCL 1 MG/ML IJ SOLN
0.2500 mg | INTRAMUSCULAR | Status: DC | PRN
  Administered 2015-11-17: 0.2 mg via INTRAVENOUS
  Administered 2015-11-17: 0.5 mg via INTRAVENOUS
  Administered 2015-11-17 (×2): 0.3 mg via INTRAVENOUS
  Administered 2015-11-17: 0.5 mg via INTRAVENOUS
  Administered 2015-11-17: 0.2 mg via INTRAVENOUS

## 2015-11-17 MED ORDER — MEPERIDINE HCL 25 MG/ML IJ SOLN: 6.2500 mg | INTRAMUSCULAR | Status: DC | PRN

## 2015-11-17 MED ORDER — BUPIVACAINE HCL (PF) 0.5 % IJ SOLN
INTRAMUSCULAR | Status: DC | PRN
  Administered 2015-11-17: 6 mL

## 2015-11-17 MED ORDER — GLYCOPYRROLATE 0.2 MG/ML IJ SOLN
INTRAMUSCULAR | Status: DC | PRN
  Administered 2015-11-17: .1 mg via INTRAVENOUS
  Administered 2015-11-17: 0.1 mg via INTRAVENOUS

## 2015-11-17 MED ORDER — PROPOFOL 10 MG/ML IV BOLUS
INTRAVENOUS | Status: DC | PRN
  Administered 2015-11-17: 200 mg via INTRAVENOUS

## 2015-11-17 MED ORDER — HYDROCODONE-ACETAMINOPHEN 7.5-325 MG/15ML PO SOLN: 10.0000 mL | ORAL | Status: DC | PRN

## 2015-11-17 MED ORDER — LIDOCAINE HCL (CARDIAC) 20 MG/ML IV SOLN
INTRAVENOUS | Status: DC | PRN
  Administered 2015-11-17: 40 mg via INTRAVENOUS

## 2015-11-17 MED ORDER — LACTATED RINGERS IV SOLN
INTRAVENOUS | Status: DC
  Administered 2015-11-17: 10:00:00 via INTRAVENOUS

## 2015-11-17 MED ORDER — OXYCODONE HCL 5 MG PO TABS: 5.0000 mg | ORAL_TABLET | Freq: Once | ORAL | Status: AC | PRN

## 2015-11-17 MED ORDER — SUCCINYLCHOLINE CHLORIDE 20 MG/ML IJ SOLN
INTRAMUSCULAR | Status: DC | PRN
  Administered 2015-11-17: 80 mg via INTRAVENOUS

## 2015-11-17 SURGICAL SUPPLY — 22 items
BLADE BOVIE TIP EXT 4 (BLADE) ×2 IMPLANT
CANISTER SUCT 1200ML W/VALVE (MISCELLANEOUS) ×2 IMPLANT
CATH RUBBER RED 8F (CATHETERS) ×2 IMPLANT
COAG SUCT 10F 3.5MM HAND CTRL (MISCELLANEOUS) ×2 IMPLANT
DRAPE HEAD BAR (DRAPES) ×2 IMPLANT
ELECT CAUTERY BLADE TIP 2.5 (TIP) ×2
ELECTRODE CAUTERY BLDE TIP 2.5 (TIP) ×1 IMPLANT
GLOVE BIO SURGEON STRL SZ7.5 (GLOVE) ×2 IMPLANT
HANDLE SUCTION POOLE (INSTRUMENTS) ×1 IMPLANT
KIT ROOM TURNOVER OR (KITS) ×2 IMPLANT
NEEDLE HYPO 25GX1X1/2 BEV (NEEDLE) ×2 IMPLANT
NS IRRIG 500ML POUR BTL (IV SOLUTION) ×2 IMPLANT
PACK TONSIL/ADENOIDS (PACKS) ×2 IMPLANT
PAD GROUND ADULT SPLIT (MISCELLANEOUS) ×2 IMPLANT
PENCIL ELECTRO HAND CTR (MISCELLANEOUS) ×2 IMPLANT
SOL ANTI-FOG 6CC FOG-OUT (MISCELLANEOUS) ×1 IMPLANT
SOL FOG-OUT ANTI-FOG 6CC (MISCELLANEOUS) ×1
SPONGE TONSIL 7/8 RF SGL LF (GAUZE/BANDAGES/DRESSINGS) ×2 IMPLANT
STRAP BODY AND KNEE 60X3 (MISCELLANEOUS) ×2 IMPLANT
SUCTION POOLE HANDLE (INSTRUMENTS) ×2
SYR 5ML LL (SYRINGE) ×2 IMPLANT
SYRINGE 10CC LL (SYRINGE) IMPLANT

## 2015-11-17 NOTE — Op Note (Signed)
PREOPERATIVE DIAGNOSIS:  TONSILLITIS  POSTOPERATIVE DIAGNOSIS: Same  OPERATION:  Tonsillectomy and adenoidectomy.  SURGEON:  Davina Pokehapman T. Libertie Hausler, MD  ANESTHESIA:  General endotracheal.  OPERATIVE FINDINGS:  Large tonsils and adenoids.  DESCRIPTION OF THE PROCEDURE:  Krista Drake was identified in the holding area and taken to the operating room and placed in the supine position.  After general endotracheal anesthesia, the table was turned 45 degrees and the patient was draped in the usual fashion for a tonsillectomy.  A mouth gag was inserted into the oral cavity and examination of the oropharynx showed the uvula was non-bifid.  There was no evidence of submucous cleft to the palate.  There were large tonsils.  A red rubber catheter was placed through the nostril.  Examination of the nasopharynx showed large obstructing adenoids.  Under indirect vision with the mirror, an adenotome was placed in the nasopharynx.  The adenoids were curetted free.  Reinspection with a mirror showed excellent removal of the adenoid.  Nasopharyngeal packs were then placed.  The operation then turned to the tonsillectomy.  Beginning on the left-hand side a tenaculum was used to grasp the tonsil and the Bovie cautery was used to dissect it free from the fossa.  In a similar fashion, the right tonsil was removed.  Meticulous hemostasis was achieved using the Bovie cautery.  With both tonsils removed and no active bleeding, the nasopharyngeal packs were removed.  Suction cautery was then used to cauterize the nasopharyngeal bed to prevent bleeding.  The red rubber catheter was removed with no active bleeding.  0.5% plain Marcaine was used to inject the anterior and posterior tonsillar pillars bilaterally.  A total of 6ml was used.  The patient tolerated the procedure well and was awakened in the operating room and taken to the recovery room in stable condition.   CULTURES:  None.  SPECIMENS:  Tonsils and  adenoids.  ESTIMATED BLOOD LOSS:  Less than 20 ml.  Fahmida Jurich T  11/17/2015  11:52 AM

## 2015-11-17 NOTE — Transfer of Care (Signed)
Immediate Anesthesia Transfer of Care Note  Patient: Krista Drake  Procedure(s) Performed: Procedure(s): TONSILLECTOMY AND ADENOIDECTOMY (N/A)  Patient Location: PACU  Anesthesia Type: General  Level of Consciousness: awake, alert  and patient cooperative  Airway and Oxygen Therapy: Patient Spontanous Breathing and Patient connected to supplemental oxygen  Post-op Assessment: Post-op Vital signs reviewed, Patient's Cardiovascular Status Stable, Respiratory Function Stable, Patent Airway and No signs of Nausea or vomiting  Post-op Vital Signs: Reviewed and stable  Complications: No apparent anesthesia complications

## 2015-11-17 NOTE — Anesthesia Procedure Notes (Signed)
Procedure Name: Intubation Date/Time: 11/17/2015 11:31 AM Performed by: Jimmy PicketAMYOT, Lashunta Frieden Pre-anesthesia Checklist: Patient identified, Emergency Drugs available, Suction available, Patient being monitored and Timeout performed Patient Re-evaluated:Patient Re-evaluated prior to inductionOxygen Delivery Method: Circle system utilized Preoxygenation: Pre-oxygenation with 100% oxygen Intubation Type: IV induction Ventilation: Mask ventilation without difficulty Laryngoscope Size: Miller and 2 Grade View: Grade I Tube type: Oral Rae Tube size: 7.0 mm Number of attempts: 1 Placement Confirmation: ETT inserted through vocal cords under direct vision,  positive ETCO2 and breath sounds checked- equal and bilateral Tube secured with: Tape Dental Injury: Teeth and Oropharynx as per pre-operative assessment

## 2015-11-17 NOTE — Anesthesia Preprocedure Evaluation (Signed)
Anesthesia Evaluation  Patient identified by MRN, date of birth, ID band  Reviewed: Allergy & Precautions, NPO status , Patient's Chart, lab work & pertinent test results  Airway Mallampati: II  TM Distance: >3 FB Neck ROM: Full    Dental no notable dental hx.    Pulmonary neg pulmonary ROS,    Pulmonary exam normal        Cardiovascular negative cardio ROS Normal cardiovascular exam     Neuro/Psych  Headaches, negative psych ROS   GI/Hepatic negative GI ROS, Neg liver ROS,   Endo/Other  negative endocrine ROS  Renal/GU negative Renal ROS  negative genitourinary   Musculoskeletal negative musculoskeletal ROS (+)   Abdominal   Peds negative pediatric ROS (+)  Hematology negative hematology ROS (+)   Anesthesia Other Findings   Reproductive/Obstetrics negative OB ROS                             Anesthesia Physical Anesthesia Plan  ASA: I  Anesthesia Plan: General   Post-op Pain Management:    Induction: Intravenous  Airway Management Planned: Oral ETT  Additional Equipment:   Intra-op Plan:   Post-operative Plan:   Informed Consent: I have reviewed the patients History and Physical, chart, labs and discussed the procedure including the risks, benefits and alternatives for the proposed anesthesia with the patient or authorized representative who has indicated his/her understanding and acceptance.     Plan Discussed with: CRNA  Anesthesia Plan Comments:         Anesthesia Quick Evaluation

## 2015-11-17 NOTE — H&P (Signed)
  H+P  Reviewed and will be scanned in later. No changes noted. 

## 2015-11-17 NOTE — Anesthesia Postprocedure Evaluation (Signed)
Anesthesia Post Note  Patient: Krista Drake  Procedure(s) Performed: Procedure(s) (LRB): TONSILLECTOMY AND ADENOIDECTOMY (N/A)  Patient location during evaluation: PACU Anesthesia Type: General Level of consciousness: awake and alert and oriented Pain management: pain level controlled Vital Signs Assessment: post-procedure vital signs reviewed and stable Respiratory status: spontaneous breathing and nonlabored ventilation Cardiovascular status: stable Postop Assessment: no signs of nausea or vomiting and adequate PO intake Anesthetic complications: no    Harolyn RutherfordJoshua Laurene Melendrez

## 2015-11-20 ENCOUNTER — Encounter: Payer: Self-pay | Admitting: Unknown Physician Specialty

## 2015-11-22 LAB — SURGICAL PATHOLOGY

## 2016-01-25 LAB — HM PAP SMEAR

## 2016-04-26 DIAGNOSIS — N760 Acute vaginitis: Secondary | ICD-10-CM | POA: Diagnosis not present

## 2016-04-26 DIAGNOSIS — Z113 Encounter for screening for infections with a predominantly sexual mode of transmission: Secondary | ICD-10-CM | POA: Diagnosis not present

## 2016-04-26 DIAGNOSIS — N898 Other specified noninflammatory disorders of vagina: Secondary | ICD-10-CM | POA: Diagnosis not present

## 2016-06-01 DIAGNOSIS — J039 Acute tonsillitis, unspecified: Secondary | ICD-10-CM | POA: Diagnosis not present

## 2016-08-09 DIAGNOSIS — R5383 Other fatigue: Secondary | ICD-10-CM | POA: Diagnosis not present

## 2016-08-09 DIAGNOSIS — G43009 Migraine without aura, not intractable, without status migrainosus: Secondary | ICD-10-CM | POA: Diagnosis not present

## 2016-08-09 DIAGNOSIS — G5603 Carpal tunnel syndrome, bilateral upper limbs: Secondary | ICD-10-CM | POA: Diagnosis not present

## 2016-09-06 DIAGNOSIS — Z113 Encounter for screening for infections with a predominantly sexual mode of transmission: Secondary | ICD-10-CM | POA: Diagnosis not present

## 2016-09-06 DIAGNOSIS — N809 Endometriosis, unspecified: Secondary | ICD-10-CM | POA: Diagnosis not present

## 2016-09-09 DIAGNOSIS — Z113 Encounter for screening for infections with a predominantly sexual mode of transmission: Secondary | ICD-10-CM | POA: Diagnosis not present

## 2016-09-12 ENCOUNTER — Telehealth: Payer: Self-pay | Admitting: Obstetrics and Gynecology

## 2016-09-12 NOTE — Telephone Encounter (Signed)
Lm with pt with negative results.

## 2016-09-12 NOTE — Telephone Encounter (Signed)
Please let patient know that testing for gonorrhea and chlamydia was negative at her last visit.  Thank you!

## 2016-09-13 DIAGNOSIS — G5603 Carpal tunnel syndrome, bilateral upper limbs: Secondary | ICD-10-CM | POA: Diagnosis not present

## 2016-09-25 DIAGNOSIS — M79601 Pain in right arm: Secondary | ICD-10-CM | POA: Diagnosis not present

## 2016-09-25 DIAGNOSIS — M79602 Pain in left arm: Secondary | ICD-10-CM | POA: Diagnosis not present

## 2016-09-25 DIAGNOSIS — M779 Enthesopathy, unspecified: Secondary | ICD-10-CM | POA: Insufficient documentation

## 2016-09-25 DIAGNOSIS — R202 Paresthesia of skin: Secondary | ICD-10-CM | POA: Diagnosis not present

## 2016-10-08 DIAGNOSIS — M25531 Pain in right wrist: Secondary | ICD-10-CM | POA: Diagnosis not present

## 2016-10-08 DIAGNOSIS — M25532 Pain in left wrist: Secondary | ICD-10-CM | POA: Diagnosis not present

## 2016-10-21 ENCOUNTER — Encounter (HOSPITAL_COMMUNITY): Payer: Self-pay | Admitting: Adult Health

## 2016-10-21 ENCOUNTER — Emergency Department (HOSPITAL_COMMUNITY): Payer: Federal, State, Local not specified - PPO

## 2016-10-21 ENCOUNTER — Emergency Department (HOSPITAL_COMMUNITY)
Admission: EM | Admit: 2016-10-21 | Discharge: 2016-10-21 | Disposition: A | Discharge status: Home / Self Care | Payer: Federal, State, Local not specified - PPO | Attending: Emergency Medicine | Admitting: Emergency Medicine

## 2016-10-21 DIAGNOSIS — Y999 Unspecified external cause status: Secondary | ICD-10-CM | POA: Diagnosis not present

## 2016-10-21 DIAGNOSIS — Z79899 Other long term (current) drug therapy: Secondary | ICD-10-CM | POA: Insufficient documentation

## 2016-10-21 DIAGNOSIS — G43009 Migraine without aura, not intractable, without status migrainosus: Secondary | ICD-10-CM | POA: Insufficient documentation

## 2016-10-21 DIAGNOSIS — S0990XA Unspecified injury of head, initial encounter: Secondary | ICD-10-CM | POA: Diagnosis not present

## 2016-10-21 DIAGNOSIS — Y939 Activity, unspecified: Secondary | ICD-10-CM | POA: Diagnosis not present

## 2016-10-21 DIAGNOSIS — W228XXA Striking against or struck by other objects, initial encounter: Secondary | ICD-10-CM | POA: Insufficient documentation

## 2016-10-21 DIAGNOSIS — G43909 Migraine, unspecified, not intractable, without status migrainosus: Secondary | ICD-10-CM | POA: Diagnosis not present

## 2016-10-21 DIAGNOSIS — Y929 Unspecified place or not applicable: Secondary | ICD-10-CM | POA: Diagnosis not present

## 2016-10-21 LAB — CBC WITH DIFFERENTIAL/PLATELET
BASOS PCT: 0 %
Basophils Absolute: 0 10*3/uL (ref 0.0–0.1)
Eosinophils Absolute: 0 10*3/uL (ref 0.0–0.7)
Eosinophils Relative: 1 %
HEMATOCRIT: 45.2 % (ref 36.0–46.0)
HEMOGLOBIN: 15.2 g/dL — AB (ref 12.0–15.0)
LYMPHS ABS: 3.2 10*3/uL (ref 0.7–4.0)
Lymphocytes Relative: 52 %
MCH: 30.4 pg (ref 26.0–34.0)
MCHC: 33.6 g/dL (ref 30.0–36.0)
MCV: 90.4 fL (ref 78.0–100.0)
MONOS PCT: 7 %
Monocytes Absolute: 0.4 10*3/uL (ref 0.1–1.0)
NEUTROS PCT: 40 %
Neutro Abs: 2.4 10*3/uL (ref 1.7–7.7)
PLATELETS: 231 10*3/uL (ref 150–400)
RBC: 5 MIL/uL (ref 3.87–5.11)
RDW: 13.5 % (ref 11.5–15.5)
WBC: 6 10*3/uL (ref 4.0–10.5)

## 2016-10-21 LAB — BASIC METABOLIC PANEL
Anion gap: 7 (ref 5–15)
BUN: 8 mg/dL (ref 6–20)
CO2: 25 mmol/L (ref 22–32)
Calcium: 9.7 mg/dL (ref 8.9–10.3)
Chloride: 104 mmol/L (ref 101–111)
Creatinine, Ser: 0.69 mg/dL (ref 0.44–1.00)
Glucose, Bld: 76 mg/dL (ref 65–99)
Potassium: 3.8 mmol/L (ref 3.5–5.1)
SODIUM: 136 mmol/L (ref 135–145)

## 2016-10-21 LAB — I-STAT BETA HCG BLOOD, ED (MC, WL, AP ONLY)

## 2016-10-21 MED ORDER — ONDANSETRON 4 MG PO TBDP
ORAL_TABLET | ORAL | Status: AC
  Filled 2016-10-21: qty 1

## 2016-10-21 MED ORDER — MORPHINE SULFATE (PF) 4 MG/ML IV SOLN
4.0000 mg | Freq: Once | INTRAVENOUS | Status: AC
  Administered 2016-10-21: 4 mg via INTRAVENOUS
  Filled 2016-10-21: qty 1

## 2016-10-21 MED ORDER — DIPHENHYDRAMINE HCL 50 MG/ML IJ SOLN
25.0000 mg | Freq: Once | INTRAMUSCULAR | Status: AC
  Administered 2016-10-21: 25 mg via INTRAVENOUS
  Filled 2016-10-21: qty 1

## 2016-10-21 MED ORDER — PROMETHAZINE HCL 25 MG PO TABS: 25.0000 mg | ORAL_TABLET | Freq: Four times a day (QID) | ORAL | 0 refills | Status: DC | PRN

## 2016-10-21 MED ORDER — ONDANSETRON 4 MG PO TBDP
4.0000 mg | ORAL_TABLET | Freq: Once | ORAL | Status: AC | PRN
  Administered 2016-10-21: 4 mg via ORAL

## 2016-10-21 MED ORDER — SODIUM CHLORIDE 0.9 % IV BOLUS (SEPSIS)
1000.0000 mL | Freq: Once | INTRAVENOUS | Status: AC
  Administered 2016-10-21: 1000 mL via INTRAVENOUS

## 2016-10-21 MED ORDER — MORPHINE SULFATE (PF) 4 MG/ML IV SOLN
INTRAVENOUS | Status: AC
  Filled 2016-10-21: qty 1

## 2016-10-21 MED ORDER — METOCLOPRAMIDE HCL 5 MG/ML IJ SOLN
10.0000 mg | Freq: Once | INTRAMUSCULAR | Status: AC
  Administered 2016-10-21: 10 mg via INTRAVENOUS
  Filled 2016-10-21: qty 2

## 2016-10-21 NOTE — ED Triage Notes (Signed)
Pt reported nausea while in  [facility]  On the way to room C-24.

## 2016-10-21 NOTE — ED Notes (Signed)
Patient transported to CT 

## 2016-10-21 NOTE — ED Notes (Signed)
ED Provider at bedside. 

## 2016-10-21 NOTE — ED Triage Notes (Signed)
PResents with head injury occurred at 11 am today, pt went under a metal table toget something that fell and came up and hit head hard, denies LOC, began having severe headache, took exedrin, vomiting and nausea and dizziness with any movement. Head very sore. Pt is alert and answers all questions appropriately.

## 2016-10-21 NOTE — Discharge Instructions (Signed)
Continue excedrin for headaches.   Expect worsening headaches, nausea for several days   Take phenergan as needed for nausea.   See your doctor  Return to ER if you have severe headaches, vomiting, dizziness.

## 2016-10-21 NOTE — ED Provider Notes (Signed)
[facility]-EMERGENCY DEPT Provider Note   CSN: [id number] Arrival date & time: 10/21/16  1428  By signing my name below, I, Marnette Burgess Long, attest that this documentation has been prepared under the direction and in the presence of Charlynne Pander, MD. Electronically Signed: Marnette Burgess Long, Scribe. 10/21/2016. 4:16 PM.   History   Chief Complaint Chief Complaint  Patient presents with  . Head Injury   The history is provided by the patient and medical records. No language interpreter was used.    HPI Comments:  Krista Drake is a 26 y.o. female with a PMHx of Endometriosis, Bell's Palsy, and Migraines, who presents to the Emergency Department complaining of sudden onset head pain s/p hitting her head on the kitchen table at 11am this morning. She states she struck the posterior head on the metal table with 9/10 pain following. Denies LOC, no bleeding reported.  Pt has associated symptoms of nausea and a severe HA. She tried Excedrin Migraine at home with no relief of her symptoms. Light exacerbates her symptoms. Pt denies emesis, back pain, and any other complaints at this time. Patient has hx of migraines and came to the ED previously for migraine headaches.    Past Medical History:  Diagnosis Date  . Bell's palsy    during middle school  . Endometriosis   . Headache   . Migraine   . Motion sickness    cars   Patient Active Problem List   Diagnosis Date Noted  . Acute tonsillitis 03/30/2015  . Appendicitis, acute 01/17/2015  . Abdominal pain, unspecified site 06/18/2013  . Hx of migraine headaches 06/18/2013   Past Surgical History:  Procedure Laterality Date  . LAPAROSCOPIC APPENDECTOMY N/A 01/17/2015   Procedure: APPENDECTOMY LAPAROSCOPIC;  Surgeon: Tiney Rouge III, MD;  Location: [facility];  Service: General;  Laterality: N/A;  . LAPAROSCOPY  08/12/13  . TONSILLECTOMY AND ADENOIDECTOMY N/A 11/17/2015   Procedure: TONSILLECTOMY AND ADENOIDECTOMY;  Surgeon: Linus Salmons, MD;   Location: [facility];  Service: ENT;  Laterality: N/A;  . WISDOM TOOTH EXTRACTION     OB History    Gravida Para Term Preterm AB Living   0             SAB TAB Ectopic Multiple Live Births                  Obstetric Comments   1st Menstrual Cycle:  14     Home Medications    Prior to Admission medications   Medication Sig Start Date End Date Taking? Authorizing Provider  HYDROcodone-acetaminophen (HYCET) 7.5-325 mg/15 ml solution Take 10 mLs by mouth every 4 (four) hours as needed for moderate pain. 11/17/15   Linus Salmons, MD  nortriptyline (PAMELOR) 10 MG capsule Take 10 mg by mouth daily.    Historical Provider, MD    Family History Family History  Problem Relation Age of Onset  . Diabetes Maternal Grandmother   . Diabetes Maternal Grandfather   . Cancer Paternal Grandmother     breast  . Anemia Mother     Social History Social History  Substance Use Topics  . Smoking status: Never Smoker  . Smokeless tobacco: Never Used  . Alcohol use Yes     Comment: 2 glass wine/month     Allergies   Caramel and Zoloft [sertraline hcl]   Review of Systems Review of Systems  Eyes: Positive for photophobia.  Gastrointestinal: Positive for nausea. Negative for vomiting.  Musculoskeletal: Negative  for back pain.  Skin: Positive for wound.  Neurological: Positive for headaches. Negative for syncope.  All other systems reviewed and are negative.    Physical Exam Updated Vital Signs BP 139/86 (BP Location: Left Arm)   Pulse 80   Temp 98.6 F (37 C) (Oral)   Resp (!) 22   Ht  (1.676 m)   Wt 210 lb (95.3 kg)   SpO2 100%   BMI 33.89 kg/m   Physical Exam  Constitutional: She is oriented to person, place, and time.  Uncomfortable, closing eyes, holding her head   HENT:  Head: Normocephalic and atraumatic.  Eyes: Conjunctivae are normal. Pupils are equal, round, and reactive to light.  Neck: Neck supple.  Cardiovascular: Normal rate and regular  rhythm.   Pulmonary/Chest: Effort normal and breath sounds normal.  Abdominal: Soft. Bowel sounds are normal.  Musculoskeletal: Normal range of motion.  Neurological: She is alert and oriented to person, place, and time.  Cranial nerve 2-12 intact. Normal Strength intact.   Skin: Skin is warm and dry.  Small scalp hematoma no bleeding.  Psychiatric: She has a normal mood and affect. Her behavior is normal.  Nursing note and vitals reviewed.    ED Treatments / Results  DIAGNOSTIC STUDIES:  Oxygen Saturation is 100% on RA, normal by my interpretation.    COORDINATION OF CARE:  4:16 PM Discussed treatment plan with pt at bedside including CT Head, blood work, and migraine cocktail and pt agreed to plan.  Labs (all labs ordered are listed, but only abnormal results are displayed) Labs Reviewed  CBC WITH DIFFERENTIAL/PLATELET  BASIC METABOLIC PANEL  I-STAT BETA HCG BLOOD, ED ([facility], [facility], [facility] ONLY)    EKG  EKG Interpretation None       Radiology Ct Head Wo Contrast  Result Date: 10/21/2016 CLINICAL DATA:  Blunt trauma with posterior head, initial encounter EXAM: CT HEAD WITHOUT CONTRAST TECHNIQUE: Contiguous axial images were obtained from the base of the skull through the vertex without intravenous contrast. COMPARISON:  None. FINDINGS: Brain: No evidence of acute infarction, hemorrhage, hydrocephalus, extra-axial collection or mass lesion/mass effect. Vascular: No hyperdense vessel or unexpected calcification. Skull: Normal. Negative for fracture or focal lesion. Sinuses/Orbits: No acute finding. Other: None. IMPRESSION: No acute intracranial abnormality noted. Electronically Signed   By: Alcide Clever M.D.   On: 10/21/2016 16:25    Procedures Procedures (including critical care time)  Medications Ordered in ED Medications  ondansetron (ZOFRAN-ODT) 4 MG disintegrating tablet (not administered)  morphine 4 MG/ML injection (not administered)  ondansetron (ZOFRAN-ODT)  disintegrating tablet 4 mg (4 mg Oral Given 10/21/16 1511)  sodium chloride 0.9 % bolus 1,000 mL (1,000 mLs Intravenous New Bag/Given 10/21/16 1633)  morphine 4 MG/ML injection 4 mg (4 mg Intravenous Given 10/21/16 1632)  metoCLOPramide (REGLAN) injection 10 mg (10 mg Intravenous Given 10/21/16 1632)  diphenhydrAMINE (BENADRYL) injection 25 mg (25 mg Intravenous Given 10/21/16 1632)     Initial Impression / Assessment and Plan / ED Course  I have reviewed the triage vital signs and the nursing notes.  Pertinent labs & imaging results that were available during my care of the patient were reviewed by me and considered in my medical decision making (see chart for details).     Krista Drake is a 26 y.o. female here with headache s/p head injury. ? Small posterior scalp hematoma. Patient appears to have severe migraines as well. Will get CT head, will give migraine cocktail.   5:07 PM  CT head unremarkable. Felt better after migraine cocktail. Will dc home.   Final Clinical Impressions(s) / ED Diagnoses   Final diagnoses:  None    New Prescriptions New Prescriptions   No medications on file   I personally performed the services described in this documentation, which was scribed in my presence. The recorded information has been reviewed and is accurate.     Charlynne Pander, MD 10/21/16 [phone]

## 2016-10-21 NOTE — ED Notes (Signed)
Pt reports she took an Excedrin migraine after hitting her head today. Pt c/o headache and nausea. Reports gagging denies vomiting.

## 2016-10-21 NOTE — ED Notes (Signed)
When giving pt morphine, the prefilled plunger back filled with saline and popped out. Wasted with charge Krista Drake and preformed override for correct medication amount.

## 2016-10-22 DIAGNOSIS — G44311 Acute post-traumatic headache, intractable: Secondary | ICD-10-CM | POA: Diagnosis not present

## 2016-11-21 ENCOUNTER — Telehealth: Payer: Self-pay

## 2016-11-21 NOTE — Telephone Encounter (Signed)
Pt calling.  Has taken two preg tests - one was positive, the other negative.  She was told she may need a blood test b/c she may have taken the preg tests too early.  Please call [phone]

## 2016-11-22 NOTE — Telephone Encounter (Signed)
Pt aware that SDJ on vaca and I will check with him when he comes back Monday and will let her know if he wants to get blood work or wait until NOB visit.

## 2016-11-26 ENCOUNTER — Other Ambulatory Visit: Payer: Self-pay | Admitting: Obstetrics & Gynecology

## 2016-11-26 ENCOUNTER — Telehealth: Payer: Self-pay

## 2016-11-26 DIAGNOSIS — N926 Irregular menstruation, unspecified: Secondary | ICD-10-CM

## 2016-11-26 NOTE — Telephone Encounter (Signed)
With concern for pregnancy, I will order beta HCG lab test to be drawn here today, and then also please make appt w Dr Jean RosenthalJackson on Thurs for f/u and possible second serial lab draw.

## 2016-11-26 NOTE — Telephone Encounter (Signed)
Pt is calling about needing her Labs done due to having endometriosis . Pt report not taking her medication and still hasn't heard about when she is to be schedule to come in to get her labs done. Please advise . SDJ left early due to being sick and will be back on Thursday.

## 2016-11-26 NOTE — Telephone Encounter (Signed)
Tried to call pt, no answer. Please let her know when she calls back that she can come in for labs and schedule appt with SDJ for NOB

## 2016-11-26 NOTE — Telephone Encounter (Signed)
-----   Message from Nadara Mustardobert P Harris, MD sent at 11/26/2016  1:21 PM EDT ----- Regarding: come in for lab today Have pt come in for lab today and then make appt for Thurs w Dr JShela Commons

## 2016-11-26 NOTE — Telephone Encounter (Signed)
See previous msg.

## 2016-11-27 ENCOUNTER — Other Ambulatory Visit: Payer: Federal, State, Local not specified - PPO

## 2016-11-27 DIAGNOSIS — N926 Irregular menstruation, unspecified: Secondary | ICD-10-CM | POA: Diagnosis not present

## 2016-11-28 ENCOUNTER — Telehealth: Payer: Self-pay | Admitting: Obstetrics & Gynecology

## 2016-11-28 LAB — BETA HCG QUANT (REF LAB): hCG Quant: <1 m[IU]/mL

## 2016-11-28 NOTE — Telephone Encounter (Signed)
Pt aware.

## 2016-11-28 NOTE — Telephone Encounter (Signed)
Pt is would like a call back of Labs results

## 2016-11-28 NOTE — Telephone Encounter (Signed)
Share w Bev/ Dr Jackey LogeJ Neg preg test

## 2016-12-03 DIAGNOSIS — N912 Amenorrhea, unspecified: Secondary | ICD-10-CM | POA: Diagnosis not present

## 2016-12-03 DIAGNOSIS — H6690 Otitis media, unspecified, unspecified ear: Secondary | ICD-10-CM | POA: Diagnosis not present

## 2016-12-03 DIAGNOSIS — G44329 Chronic post-traumatic headache, not intractable: Secondary | ICD-10-CM | POA: Diagnosis not present

## 2016-12-04 ENCOUNTER — Ambulatory Visit: Payer: Federal, State, Local not specified - PPO | Admitting: Obstetrics and Gynecology

## 2016-12-17 ENCOUNTER — Ambulatory Visit (INDEPENDENT_AMBULATORY_CARE_PROVIDER_SITE_OTHER): Payer: Federal, State, Local not specified - PPO | Admitting: Obstetrics and Gynecology

## 2016-12-17 ENCOUNTER — Encounter: Payer: Self-pay | Admitting: Obstetrics and Gynecology

## 2016-12-17 VITALS — BP 110/60 | HR 76 | Ht 65.0 in | Wt 216.0 lb

## 2016-12-17 DIAGNOSIS — Z3169 Encounter for other general counseling and advice on procreation: Secondary | ICD-10-CM | POA: Diagnosis not present

## 2016-12-17 NOTE — Progress Notes (Signed)
Obstetrics & Gynecology Office Visit   Chief Complaint  Patient presents with  . Advice Only  Desires to conceive.  History of Present Illness: 26 y.o. G0P0 female who presents to discuss getting pregnant. She has taken norethindrone in the past for control of endometriosis symptoms.  She has not been taking this medication for several months and has ad no pain. Her menstrual cycles are coming with regularity, lasting 5 days, with no clots.  She has no other medical problems that need addressing at this time. She has never been pregnant before.  She seeks advice on getting pregnant in the setting of endometriosis.     Review of Systems: Review of Systems  Constitutional: Negative.   HENT: Negative.   Eyes: Negative.   Respiratory: Negative.   Cardiovascular: Negative.   Gastrointestinal: Negative.   Genitourinary: Negative.   Musculoskeletal: Negative.   Skin: Negative.   Neurological: Negative.   Psychiatric/Behavioral: Negative.     Past Medical History:  Diagnosis Date  . Bell's palsy    during middle school  . Endometriosis   . Headache   . Migraine   . Motion sickness    cars    Past Surgical History:  Procedure Laterality Date  . LAPAROSCOPIC APPENDECTOMY N/A 01/17/2015   Procedure: APPENDECTOMY LAPAROSCOPIC;  Surgeon: Tiney Rougealph Ely III, MD;  Location: [facility];  Service: General;  Laterality: N/A;  . LAPAROSCOPY  08/12/13  . TONSILLECTOMY AND ADENOIDECTOMY N/A 11/17/2015   Procedure: TONSILLECTOMY AND ADENOIDECTOMY;  Surgeon: Linus Salmonshapman McQueen, MD;  [facility];  Service: ENT;  Laterality: N/A;  . WISDOM TOOTH EXTRACTION      Gynecologic History: Patient's last menstrual period was 11/25/2016.  Obstetric History: G0P0  Family History  Problem Relation Age of Onset  . Anemia Mother   . Diabetes Maternal Grandmother   . Diabetes Maternal Grandfather   . Cancer Paternal Grandmother        breast    Social History   Social History  . Marital  status: Single    Spouse name: N/A  . Number of children: N/A  . Years of education: N/A   Occupational History  . Not on file.   Social History Main Topics  . Smoking status: Never Smoker  . Smokeless tobacco: Never Used  . Alcohol use Yes     Comment: 2 glass wine/month  . Drug use: No  . Sexual activity: Yes    Birth control/ protection: None   Other Topics Concern  . Not on file   Social History Narrative  . No narrative on file   :  Allergies  Allergen Reactions  . Caramel Anaphylaxis  . Zoloft [Sertraline Hcl] Hives  . Sertraline Rash    Medications:   Medication Sig Start Date End Date Taking? Authorizing Provider  nortriptyline (PAMELOR) 10 MG capsule Take 10 mg by mouth daily.    [provider]    Physical Exam BP 110/60   Pulse 76   Ht 5\' 5"  (1.651 m)   Wt 216 lb (98 kg)   LMP 11/25/2016   BMI 35.94 kg/m  Patient's last menstrual period was 11/25/2016. Physical Exam  Constitutional: She is oriented to person, place, and time and well-developed, well-nourished, and in no distress. No distress.  HENT:  Head: Normocephalic and atraumatic.  Eyes: Conjunctivae are normal. No scleral icterus.  Neck: Normal range of motion. Neck supple. No thyromegaly present.  Cardiovascular: Normal rate, regular rhythm and normal heart sounds.  Exam reveals  no gallop and no friction rub.   No murmur heard. Pulmonary/Chest: Effort normal and breath sounds normal. No respiratory distress. She has no wheezes. She has no rales. She exhibits no tenderness.  Abdominal: Soft. Bowel sounds are normal. She exhibits no distension and no mass. There is no tenderness. There is no rebound and no guarding.  Musculoskeletal: Normal range of motion. She exhibits no edema.  Lymphadenopathy:    She has no cervical adenopathy.  Neurological: She is alert and oriented to person, place, and time. No cranial nerve deficit.  Skin: Skin is warm and dry. No erythema.  Psychiatric:  Mood, affect and judgment normal.     Assessment: 26 y.o. G0P0 here for pre-conception counseling.  Plan: She has stopped taking norethindrone and is doing well. Discussed the endometriosis is associated with infertility or sub-fertility.  However, many women with endometriosis are able to safely conceive.  She has only been trying for several months. Her partner has no medical problems and takes no medications.  Apart from obesity, she has no significant other medical problems.  She was encouraged to act as though she could be pregnant, meaning taken a prenatal vitamin with an extra folic acid (about 1,000 mcg).  She could optimize her health by losing weight. However, if she gets pregnant, active dieting in pregnancy is not recommended.  Discussed that an average, healthy couple can take several months to become pregnant and that in a give month only about 20% of couple achieve pregnancy under normal circumstances.  Because she is young, she should allow 12-18 months prior to initiating a workup for not become pregnant.  All questions were answered with her and her partner present.  30 minutes spent in face to face discussion with > 50% spent in counseling and management of her pre-conception counseling.Marland Kitchen    Thomasene Mohair, MD 12/19/2016 11:47 AM

## 2017-01-27 ENCOUNTER — Ambulatory Visit: Payer: Federal, State, Local not specified - PPO | Admitting: Obstetrics and Gynecology

## 2017-04-10 ENCOUNTER — Ambulatory Visit: Payer: Federal, State, Local not specified - PPO | Admitting: Obstetrics and Gynecology

## 2017-05-01 ENCOUNTER — Ambulatory Visit: Payer: Federal, State, Local not specified - PPO | Admitting: Obstetrics and Gynecology

## 2017-06-03 ENCOUNTER — Ambulatory Visit: Payer: Federal, State, Local not specified - PPO | Admitting: Obstetrics and Gynecology

## 2017-06-18 ENCOUNTER — Ambulatory Visit (INDEPENDENT_AMBULATORY_CARE_PROVIDER_SITE_OTHER): Payer: Federal, State, Local not specified - PPO | Admitting: Obstetrics and Gynecology

## 2017-06-18 ENCOUNTER — Encounter: Payer: Self-pay | Admitting: Obstetrics and Gynecology

## 2017-06-18 VITALS — BP 122/70 | Ht 65.0 in | Wt 205.0 lb

## 2017-06-18 DIAGNOSIS — Z1331 Encounter for screening for depression: Secondary | ICD-10-CM | POA: Diagnosis not present

## 2017-06-18 DIAGNOSIS — Z113 Encounter for screening for infections with a predominantly sexual mode of transmission: Secondary | ICD-10-CM

## 2017-06-18 DIAGNOSIS — Z01419 Encounter for gynecological examination (general) (routine) without abnormal findings: Secondary | ICD-10-CM | POA: Diagnosis not present

## 2017-06-18 DIAGNOSIS — Z1339 Encounter for screening examination for other mental health and behavioral disorders: Secondary | ICD-10-CM

## 2017-06-18 NOTE — Progress Notes (Signed)
Gynecology Annual Exam  PCP: Patient, No Pcp Per  Chief Complaint  Patient presents with  . Annual Exam   History of Present Illness:  Ms. Krista Drake is a 26 y.o. G0P0 who LMP was Patient's last menstrual period was 05/28/2017., presents today for her annual examination.  Her menses are regular every 28-30 days, lasting 6 day(s).  Dysmenorrhea moderate, occurring first 1-2 days of flow. She does not have intermenstrual bleeding.  She is single partner, contraception - none.  Last Pap: 1.5 years   Results were: no abnormalities /neg HPV DNA not done Hx of STDs: chlamydia  There is FH of breast cancer in her maternal grandmother's sister, which is also her father's second cousin. There is no FH of ovarian cancer. The patient does not do self-breast exams.  Tobacco use: The patient denies current or previous tobacco use. Alcohol use: she does have wine several nights per week Exercise: not active  The patient wears seatbelts: yes.   The patient reports that domestic violence in her life is absent.   Still attempting conception and taking PNVs.   Past Medical History:  Diagnosis Date  . Bell's palsy    during middle school  . Endometriosis   . Headache   . Migraine   . Motion sickness    cars    Past Surgical History:  Procedure Laterality Date  . LAPAROSCOPIC APPENDECTOMY N/A 01/17/2015   Procedure: APPENDECTOMY LAPAROSCOPIC;  Surgeon: Tiney Rougealph Ely III, MD;  Location: [facility];  Service: General;  Laterality: N/A;  . LAPAROSCOPY  08/12/13  . TONSILLECTOMY AND ADENOIDECTOMY N/A 11/17/2015   Procedure: TONSILLECTOMY AND ADENOIDECTOMY;  Surgeon: Linus Salmonshapman McQueen, MD;  [facility];  Service: ENT;  Laterality: N/A;  . WISDOM TOOTH EXTRACTION      Prior to Admission medications   Medication Sig Start Date End Date Taking? Authorizing Provider  nortriptyline (PAMELOR) 10 MG capsule Take 10 mg by mouth daily.    [provider]  SUMAtriptan (IMITREX)  50 MG tablet Take 50 mg by mouth.    [provider]    Allergies  Allergen Reactions  . Caramel Anaphylaxis  . Zoloft [Sertraline Hcl] Hives  . Sertraline Rash   Obstetric History: G0P0  Social History   Socioeconomic History  . Marital status: Single    Spouse name: Not on file  . Number of children: Not on file  . Years of education: Not on file  . Highest education level: Not on file  Social Needs  . Financial resource strain: Not on file  . Food insecurity - worry: Not on file  . Food insecurity - inability: Not on file  . Transportation needs - medical: Not on file  . Transportation needs - non-medical: Not on file  Occupational History  . Not on file  Tobacco Use  . Smoking status: Never Smoker  . Smokeless tobacco: Never Used  Substance and Sexual Activity  . Alcohol use: Yes    Comment: 2 glass wine/month  . Drug use: No  . Sexual activity: Yes    Birth control/protection: None  Other Topics Concern  . Not on file  Social History Narrative  . Not on file    Family History  Problem Relation Age of Onset  . Anemia Mother   . Diabetes Maternal Grandmother   . Diabetes Maternal Grandfather   . Cancer Paternal Grandmother        breast    Review of Systems  Constitutional:  Negative.   HENT: Negative.   Eyes: Negative.   Respiratory: Negative.   Cardiovascular: Negative.   Gastrointestinal: Negative.   Genitourinary: Negative.   Musculoskeletal: Negative.   Skin: Negative.   Neurological: Negative.   Psychiatric/Behavioral: Negative.      Physical Exam BP 122/70   Ht 5\' 5"  (1.651 m)   Wt 205 lb (93 kg)   LMP 05/28/2017   BMI 34.11 kg/m    Physical Exam  Constitutional: She is oriented to person, place, and time. She appears well-developed and well-nourished. No distress.  Genitourinary: Uterus normal. Pelvic exam was performed with patient supine. There is no rash, tenderness, lesion or injury on the right labia. There is no rash,  tenderness, lesion or injury on the left labia. No erythema, tenderness or bleeding in the vagina. No signs of injury around the vagina. No vaginal discharge found. Right adnexum does not display mass, does not display tenderness and does not display fullness. Left adnexum does not display mass, does not display tenderness and does not display fullness. Cervix does not exhibit motion tenderness, lesion, discharge or polyp.   Uterus is mobile and anteverted. Uterus is not enlarged, tender or exhibiting a mass.  Genitourinary Comments: Exam limited by patient's body habitus  HENT:  Head: Normocephalic and atraumatic.  Eyes: EOM are normal. No scleral icterus.  Neck: Normal range of motion. Neck supple. No thyromegaly present.  Cardiovascular: Normal rate and regular rhythm. Exam reveals no gallop and no friction rub.  No murmur heard. Pulmonary/Chest: Effort normal and breath sounds normal. No respiratory distress. She has no wheezes. She has no rales. Right breast exhibits no inverted nipple, no mass, no nipple discharge, no skin change and no tenderness. Left breast exhibits no inverted nipple, no mass, no nipple discharge, no skin change and no tenderness.  Abdominal: Soft. Bowel sounds are normal. She exhibits no distension and no mass. There is no tenderness. There is no rebound and no guarding.  Musculoskeletal: Normal range of motion. She exhibits no edema or tenderness.  Lymphadenopathy:    She has no cervical adenopathy.       Right: No inguinal adenopathy present.       Left: No inguinal adenopathy present.  Neurological: She is alert and oriented to person, place, and time. No cranial nerve deficit.  Skin: Skin is warm and dry. No rash noted. No erythema.  Psychiatric: She has a normal mood and affect. Her behavior is normal. Judgment normal.   Female chaperone present for pelvic and breast  portions of the physical exam  Results: AUDIT Questionnaire (screen for alcoholism):  7 PHQ-9: 3   Assessment: 26 y.o. G0P0 female here for routine annual gynecologic examination  Plan: Problem List Items Addressed This Visit    None    Visit Diagnoses    Women's annual routine gynecological examination    -  Primary   Relevant Orders   GC/Chlamydia Probe Amp   Screen for STD (sexually transmitted disease)       Relevant Orders   GC/Chlamydia Probe Amp   Screening for depression       Screening for alcohol problem          Screening: -- Blood pressure screen normal -- Weight screening: obese: discussed management options, including lifestyle, dietary, and exercise. -- Depression screening negative (PHQ-9) -- Nutrition: normal -- cholesterol screening: not due for screening -- osteoporosis screening: not due -- tobacco screening: not using -- alcohol screening: AUDIT questionnaire indicates low-risk usage. --  family history of breast cancer screening: done. not at high risk. -- no evidence of domestic violence or intimate partner violence. -- STD screening: gonorrhea/chlamydia NAAT collected -- pap smear not collected per ASCCP guidelines -- flu vaccine did not receive   Preconception counseling: discussed ideal weight with healthy lifestyle, decreased alcohol intake and maximizing the medications she is taking.    Thomasene MohairStephen Jackson, MD 06/18/2017 6:53 PM

## 2017-06-21 LAB — CHLAMYDIA/GONOCOCCUS/TRICHOMONAS, NAA
CHLAMYDIA BY NAA: NEGATIVE
Gonococcus by NAA: NEGATIVE
Trich vag by NAA: NEGATIVE

## 2017-07-03 ENCOUNTER — Encounter: Payer: Self-pay | Admitting: Obstetrics and Gynecology

## 2017-07-28 ENCOUNTER — Emergency Department (HOSPITAL_COMMUNITY)
Admission: EM | Admit: 2017-07-28 | Discharge: 2017-07-28 | Disposition: A | Discharge status: Home / Self Care | Payer: Federal, State, Local not specified - PPO | Attending: Emergency Medicine | Admitting: Emergency Medicine

## 2017-07-28 ENCOUNTER — Encounter (HOSPITAL_COMMUNITY): Payer: Self-pay

## 2017-07-28 ENCOUNTER — Other Ambulatory Visit: Payer: Self-pay

## 2017-07-28 DIAGNOSIS — R51 Headache: Secondary | ICD-10-CM | POA: Insufficient documentation

## 2017-07-28 DIAGNOSIS — G43909 Migraine, unspecified, not intractable, without status migrainosus: Secondary | ICD-10-CM | POA: Diagnosis not present

## 2017-07-28 DIAGNOSIS — R519 Headache, unspecified: Secondary | ICD-10-CM

## 2017-07-28 MED ORDER — ONDANSETRON HCL 4 MG/2ML IJ SOLN: 4.0000 mg | Freq: Once | INTRAMUSCULAR | Status: DC

## 2017-07-28 MED ORDER — SODIUM CHLORIDE 0.9 % IV BOLUS (SEPSIS)
500.0000 mL | Freq: Once | INTRAVENOUS | Status: AC
  Administered 2017-07-28: 500 mL via INTRAVENOUS

## 2017-07-28 MED ORDER — DIPHENHYDRAMINE HCL 50 MG/ML IJ SOLN
25.0000 mg | Freq: Once | INTRAMUSCULAR | Status: AC
  Administered 2017-07-28: 25 mg via INTRAVENOUS
  Filled 2017-07-28: qty 1

## 2017-07-28 MED ORDER — METOCLOPRAMIDE HCL 5 MG/ML IJ SOLN
10.0000 mg | Freq: Once | INTRAMUSCULAR | Status: AC
  Administered 2017-07-28: 10 mg via INTRAVENOUS
  Filled 2017-07-28: qty 2

## 2017-07-28 NOTE — ED Triage Notes (Signed)
Per Pt, Pt is coming from home with Migraine that started two days ago with nausea and photo sensitivity. Hx of the same.

## 2017-07-28 NOTE — Discharge Instructions (Signed)
Please read and follow all provided instructions.  Your diagnoses today include:  1. Acute nonintractable headache, unspecified headache type     Tests performed today include:  Vital signs. See below for your results today.   Medications:  In the Emergency Department you received:  Reglan - antinausea/headache medication  Benadryl - antihistamine to counteract potential side effects of reglan  Take any prescribed medications only as directed.  Additional information:  Follow any educational materials contained in this packet.  You are having a headache. No specific cause was found today for your headache. It may have been a migraine or other cause of headache. Stress, anxiety, fatigue, and depression are common triggers for headaches.   Your headache today does not appear to be life-threatening or require hospitalization, but often the exact cause of headaches is not determined in the emergency department. Therefore, follow-up with your doctor is very important to find out what may have caused your headache and whether or not you need any further diagnostic testing or treatment.   Sometimes headaches can appear benign (not harmful), but then more serious symptoms can develop which should prompt an immediate re-evaluation by your doctor or the emergency department.  BE VERY CAREFUL not to take multiple medicines containing Tylenol (also called acetaminophen). Doing so can lead to an overdose which can damage your liver and cause liver failure and possibly death.   Follow-up instructions: Please follow-up with your primary care provider in the next 3 days for further evaluation of your symptoms.   Return instructions:   Please return to the Emergency Department if you experience worsening symptoms.  Return if the medications do not resolve your headache, if it recurs, or if you have multiple episodes of vomiting or cannot keep down fluids.  Return if you have a change from the  usual headache.  RETURN IMMEDIATELY IF you:  Develop a sudden, severe headache  Develop confusion or become poorly responsive or faint  Develop a fever above 100.55F or problem breathing  Have a change in speech, vision, swallowing, or understanding  Develop new weakness, numbness, tingling, incoordination in your arms or legs  Have a seizure  Please return if you have any other emergent concerns.  Additional Information:  Your vital signs today were: BP (!) 144/75 (BP Location: Right Arm)    Pulse 78    Temp 98.1 F (36.7 C) (Oral)    Resp 16    Ht 5\' 5"  (1.651 m)    Wt 93 kg (205 lb)    LMP 07/24/2017    SpO2 99%    BMI 34.11 kg/m  If your blood pressure (BP) was elevated above 135/85 this visit, please have this repeated by your doctor within one month. --------------

## 2017-07-28 NOTE — ED Provider Notes (Signed)
[facility] Provider Note   CSN: [id number] Arrival date & time: 07/28/17  1019     History   Chief Complaint Chief Complaint  Patient presents with  . Migraine    HPI Krista Drake is a 27 y.o. female.  Patient with history of migraine headaches presents to the emergency department today with 1 day history of frontal headache with photosensitivity and nausea without vomiting.  Headache began gradually and worsened.  Home medications have not been helping.  States that current headache is very similar to previous headaches that she has had.  No fevers or neck pain.  She denies acute head injury preceding the event.  Patient denies signs of stroke including: facial droop, slurred speech, aphasia, weakness/numbness in extremities, imbalance/trouble walking. Alleviating factors: none.         Past Medical History:  Diagnosis Date  . Bell's palsy    during middle school  . Endometriosis   . Family history of breast cancer   . Headache   . Migraine   . Motion sickness    cars    Patient Active Problem List   Diagnosis Date Noted  . Tendinitis 09/25/2016  . Acute tonsillitis 03/30/2015  . Appendicitis, acute 01/17/2015  . Abdominal pain, unspecified site 06/18/2013  . Hx of migraine headaches 06/18/2013    Past Surgical History:  Procedure Laterality Date  . LAPAROSCOPIC APPENDECTOMY N/A 01/17/2015   Procedure: APPENDECTOMY LAPAROSCOPIC;  Surgeon: Tiney Rougealph Ely III, MD;  Location: [facility] OR[facility];  Service: General;  Laterality: N/A;  . LAPAROSCOPY  08/12/13  . TONSILLECTOMY AND ADENOIDECTOMY N/A 11/17/2015   Procedure: TONSILLECTOMY AND ADENOIDECTOMY;  Surgeon: Linus Salmonshapman McQueen, MD;  [facility];  Service: ENT;  Laterality: N/A;  . WISDOM TOOTH EXTRACTION      OB History    Gravida Para Term Preterm AB Living   0             SAB TAB Ectopic Multiple Live Births                  Obstetric Comments   1st Menstrual  Cycle:  12       Home Medications    Prior to Admission medications   Medication Sig Start Date End Date Taking? Authorizing Provider  aspirin-acetaminophen-caffeine (EXCEDRIN MIGRAINE) [phone] MG tablet Take 2 tablets by mouth every 6 (six) hours as needed for headache or migraine.   Yes [provider]  Aspirin-Acetaminophen-Caffeine (GOODY HEADACHE PO) Take 1 Package by mouth as needed (headache).   Yes [provider]    Family History Family History  Problem Relation Age of Onset  . Anemia Mother   . Diabetes Maternal Grandmother   . Diabetes Maternal Grandfather   . Breast cancer Paternal Grandmother [id number]  . Breast cancer Other 539    Social History Social History   Tobacco Use  . Smoking status: Never Smoker  . Smokeless tobacco: Never Used  Substance Use Topics  . Alcohol use: Yes    Comment: 2 glass wine/month  . Drug use: No     Allergies   Caramel; Zoloft [sertraline hcl]; and Sertraline   Review of Systems Review of Systems  Constitutional: Negative for fever.  HENT: Negative for congestion, dental problem, rhinorrhea and sinus pressure.   Eyes: Negative for photophobia, discharge, redness and visual disturbance.  Respiratory: Negative for shortness of breath.   Cardiovascular: Negative for chest pain.  Gastrointestinal: Negative for nausea and vomiting.  Musculoskeletal: Negative for gait problem, neck pain and neck stiffness.  Skin: Negative for rash.  Neurological: Positive for headaches. Negative for syncope, speech difficulty, weakness, light-headedness and numbness.  Psychiatric/Behavioral: Negative for confusion.     Physical Exam Updated Vital Signs BP (!) 144/75 (BP Location: Right Arm)   Pulse 78   Temp 98.1 F (36.7 C) (Oral)   Resp 16   Ht 5\' 5"  (1.651 m)   Wt 93 kg (205 lb)   LMP 07/24/2017   SpO2 99%   BMI 34.11 kg/m   Physical Exam  Constitutional: She is oriented to person, place, and time. She appears  well-developed and well-nourished.  HENT:  Head: Normocephalic and atraumatic.  Right Ear: Tympanic membrane, external ear and ear canal normal.  Left Ear: Tympanic membrane, external ear and ear canal normal.  Nose: Nose normal.  Mouth/Throat: Uvula is midline, oropharynx is clear and moist and mucous membranes are normal.  Eyes: Conjunctivae, EOM and lids are normal. Pupils are equal, round, and reactive to light. Right eye exhibits no nystagmus. Left eye exhibits no nystagmus.  Neck: Normal range of motion. Neck supple.  Cardiovascular: Normal rate and regular rhythm.  Pulmonary/Chest: Effort normal and breath sounds normal. No stridor. No respiratory distress. She has no wheezes.  Abdominal: Soft. There is no tenderness.  Musculoskeletal:       Cervical back: She exhibits normal range of motion, no tenderness and no bony tenderness.  Neurological: She is alert and oriented to person, place, and time. She has normal strength and normal reflexes. No cranial nerve deficit or sensory deficit. She displays a negative Romberg sign. Coordination and gait normal. GCS eye subscore is 4. GCS verbal subscore is 5. GCS motor subscore is 6.  Skin: Skin is warm and dry.  Psychiatric: She has a normal mood and affect.  Nursing note and vitals reviewed.    ED Treatments / Results   Procedures Procedures (including critical care time)  Medications Ordered in ED Medications  metoCLOPramide (REGLAN) injection 10 mg (10 mg Intravenous Given 07/28/17 1130)  diphenhydrAMINE (BENADRYL) injection 25 mg (25 mg Intravenous Given 07/28/17 1130)  sodium chloride 0.9 % bolus 500 mL (0 mLs Intravenous Stopped 07/28/17 1405)     Initial Impression / Assessment and Plan / ED Course  I have reviewed the triage vital signs and the nursing notes.  Pertinent labs & imaging results that were available during my care of the patient were reviewed by me and considered in my medical decision making (see chart for  details).     Patient seen and examined.  Neuro exam is reassuring.  Patient states that her symptoms are typical.  Do not feel that advanced workup indicated at this time.  Medications ordered.   Vital signs reviewed and are as follows: BP (!) 144/75 (BP Location: Right Arm)   Pulse 78   Temp 98.1 F (36.7 C) (Oral)   Resp 16   Ht 5\' 5"  (1.651 m)   Wt 93 kg (205 lb)   LMP 07/24/2017   SpO2 99%   BMI 34.11 kg/m   Patient with resolution of headache after treatment.  We will discharged home.  She will follow-up with her doctor as needed.  Patient counseled to return if they have weakness in their arms or legs, slurred speech, trouble walking or talking, confusion, trouble with their balance, or if they have any other concerns. Patient verbalizes understanding and agrees with plan.    Final Clinical Impressions(s) / ED  Diagnoses   Final diagnoses:  Acute nonintractable headache, unspecified headache type   Patient without high-risk features of headache including: sudden onset/thunderclap HA, no similar headache in past, altered mental status, accompanying seizure, headache with exertion, age > 63, history of immunocompromise, neck or shoulder pain, fever, use of anticoagulation, family history of spontaneous SAH, concomitant drug use, toxic exposure.   Patient has a normal complete neurological exam, normal vital signs, normal level of consciousness, no signs of meningismus, is well-appearing/non-toxic appearing, no signs of trauma.   Imaging with CT/MRI not indicated given history and physical exam findings.   No dangerous or life-threatening conditions suspected or identified by history, physical exam, and by work-up. No indications for hospitalization identified.    ED Discharge Orders    None       Renne Crigler, Cordelia Poche 07/28/17 1533    Doug Sou, MD 07/28/17 [phone]

## 2018-01-27 DIAGNOSIS — Z113 Encounter for screening for infections with a predominantly sexual mode of transmission: Secondary | ICD-10-CM | POA: Diagnosis not present

## 2018-01-27 DIAGNOSIS — Z01419 Encounter for gynecological examination (general) (routine) without abnormal findings: Secondary | ICD-10-CM | POA: Diagnosis not present

## 2018-01-27 DIAGNOSIS — N926 Irregular menstruation, unspecified: Secondary | ICD-10-CM | POA: Diagnosis not present

## 2018-01-27 DIAGNOSIS — Z319 Encounter for procreative management, unspecified: Secondary | ICD-10-CM | POA: Diagnosis not present

## 2018-02-09 DIAGNOSIS — Z3009 Encounter for other general counseling and advice on contraception: Secondary | ICD-10-CM | POA: Diagnosis not present

## 2018-02-09 DIAGNOSIS — Z8742 Personal history of other diseases of the female genital tract: Secondary | ICD-10-CM | POA: Diagnosis not present

## 2018-02-09 DIAGNOSIS — Z6841 Body Mass Index (BMI) 40.0 and over, adult: Secondary | ICD-10-CM | POA: Diagnosis not present

## 2018-02-09 DIAGNOSIS — N926 Irregular menstruation, unspecified: Secondary | ICD-10-CM | POA: Diagnosis not present

## 2018-04-01 DIAGNOSIS — I10 Essential (primary) hypertension: Secondary | ICD-10-CM | POA: Diagnosis not present

## 2018-04-01 DIAGNOSIS — Z113 Encounter for screening for infections with a predominantly sexual mode of transmission: Secondary | ICD-10-CM | POA: Diagnosis not present

## 2018-04-01 DIAGNOSIS — Z3A00 Weeks of gestation of pregnancy not specified: Secondary | ICD-10-CM | POA: Diagnosis not present

## 2018-04-01 DIAGNOSIS — N912 Amenorrhea, unspecified: Secondary | ICD-10-CM | POA: Diagnosis not present

## 2018-04-13 DIAGNOSIS — Z3A08 8 weeks gestation of pregnancy: Secondary | ICD-10-CM | POA: Diagnosis not present

## 2018-04-13 DIAGNOSIS — O99351 Diseases of the nervous system complicating pregnancy, first trimester: Secondary | ICD-10-CM | POA: Diagnosis not present

## 2018-04-13 DIAGNOSIS — R51 Headache: Secondary | ICD-10-CM | POA: Diagnosis not present

## 2018-04-13 DIAGNOSIS — G43809 Other migraine, not intractable, without status migrainosus: Secondary | ICD-10-CM | POA: Diagnosis not present

## 2018-04-14 DIAGNOSIS — O99351 Diseases of the nervous system complicating pregnancy, first trimester: Secondary | ICD-10-CM | POA: Diagnosis not present

## 2018-04-14 DIAGNOSIS — Z3A08 8 weeks gestation of pregnancy: Secondary | ICD-10-CM | POA: Diagnosis not present

## 2018-04-14 DIAGNOSIS — R51 Headache: Secondary | ICD-10-CM | POA: Diagnosis not present

## 2018-04-14 DIAGNOSIS — G43809 Other migraine, not intractable, without status migrainosus: Secondary | ICD-10-CM | POA: Diagnosis not present

## 2018-04-15 DIAGNOSIS — N809 Endometriosis, unspecified: Secondary | ICD-10-CM | POA: Diagnosis not present

## 2018-04-15 DIAGNOSIS — Z3A09 9 weeks gestation of pregnancy: Secondary | ICD-10-CM | POA: Diagnosis not present

## 2018-04-15 DIAGNOSIS — E282 Polycystic ovarian syndrome: Secondary | ICD-10-CM | POA: Diagnosis not present

## 2018-04-15 DIAGNOSIS — O99281 Endocrine, nutritional and metabolic diseases complicating pregnancy, first trimester: Secondary | ICD-10-CM | POA: Diagnosis not present

## 2018-04-15 DIAGNOSIS — R51 Headache: Secondary | ICD-10-CM | POA: Diagnosis not present

## 2018-04-15 DIAGNOSIS — O2691 Pregnancy related conditions, unspecified, first trimester: Secondary | ICD-10-CM | POA: Diagnosis not present

## 2018-04-17 DIAGNOSIS — Z3201 Encounter for pregnancy test, result positive: Secondary | ICD-10-CM | POA: Diagnosis not present

## 2018-05-12 DIAGNOSIS — Z363 Encounter for antenatal screening for malformations: Secondary | ICD-10-CM | POA: Diagnosis not present

## 2018-05-12 DIAGNOSIS — Z3682 Encounter for antenatal screening for nuchal translucency: Secondary | ICD-10-CM | POA: Diagnosis not present

## 2018-05-12 DIAGNOSIS — Z3A12 12 weeks gestation of pregnancy: Secondary | ICD-10-CM | POA: Diagnosis not present

## 2018-05-18 DIAGNOSIS — Z369 Encounter for antenatal screening, unspecified: Secondary | ICD-10-CM | POA: Diagnosis not present

## 2018-05-19 DIAGNOSIS — O09891 Supervision of other high risk pregnancies, first trimester: Secondary | ICD-10-CM | POA: Diagnosis not present

## 2018-06-05 DIAGNOSIS — E282 Polycystic ovarian syndrome: Secondary | ICD-10-CM | POA: Diagnosis not present

## 2018-06-05 DIAGNOSIS — R0602 Shortness of breath: Secondary | ICD-10-CM | POA: Diagnosis not present

## 2018-06-05 DIAGNOSIS — R51 Headache: Secondary | ICD-10-CM | POA: Diagnosis not present

## 2018-06-30 DIAGNOSIS — E6609 Other obesity due to excess calories: Secondary | ICD-10-CM | POA: Diagnosis not present

## 2018-06-30 DIAGNOSIS — O99212 Obesity complicating pregnancy, second trimester: Secondary | ICD-10-CM | POA: Diagnosis not present

## 2018-06-30 DIAGNOSIS — Z3A19 19 weeks gestation of pregnancy: Secondary | ICD-10-CM | POA: Diagnosis not present

## 2018-06-30 DIAGNOSIS — Z6837 Body mass index (BMI) 37.0-37.9, adult: Secondary | ICD-10-CM | POA: Diagnosis not present

## 2018-08-28 DIAGNOSIS — O0990 Supervision of high risk pregnancy, unspecified, unspecified trimester: Secondary | ICD-10-CM | POA: Diagnosis not present

## 2018-09-01 DIAGNOSIS — O09513 Supervision of elderly primigravida, third trimester: Secondary | ICD-10-CM | POA: Diagnosis not present

## 2018-09-01 DIAGNOSIS — R0789 Other chest pain: Secondary | ICD-10-CM | POA: Diagnosis not present

## 2018-09-01 DIAGNOSIS — O26893 Other specified pregnancy related conditions, third trimester: Secondary | ICD-10-CM | POA: Diagnosis not present

## 2018-09-01 DIAGNOSIS — R0602 Shortness of breath: Secondary | ICD-10-CM | POA: Diagnosis not present

## 2018-09-01 DIAGNOSIS — R1013 Epigastric pain: Secondary | ICD-10-CM | POA: Diagnosis not present

## 2018-09-01 DIAGNOSIS — O9989 Other specified diseases and conditions complicating pregnancy, childbirth and the puerperium: Secondary | ICD-10-CM | POA: Diagnosis not present

## 2018-09-01 DIAGNOSIS — R000 Tachycardia, unspecified: Secondary | ICD-10-CM | POA: Diagnosis not present

## 2018-09-01 DIAGNOSIS — Z3A28 28 weeks gestation of pregnancy: Secondary | ICD-10-CM | POA: Diagnosis not present

## 2018-09-03 DIAGNOSIS — Z3A28 28 weeks gestation of pregnancy: Secondary | ICD-10-CM | POA: Diagnosis not present

## 2018-09-03 DIAGNOSIS — R109 Unspecified abdominal pain: Secondary | ICD-10-CM | POA: Diagnosis not present

## 2018-09-03 DIAGNOSIS — O26893 Other specified pregnancy related conditions, third trimester: Secondary | ICD-10-CM | POA: Diagnosis not present

## 2018-09-03 DIAGNOSIS — O99213 Obesity complicating pregnancy, third trimester: Secondary | ICD-10-CM | POA: Diagnosis not present

## 2018-09-03 DIAGNOSIS — O10913 Unspecified pre-existing hypertension complicating pregnancy, third trimester: Secondary | ICD-10-CM | POA: Diagnosis not present

## 2018-09-03 DIAGNOSIS — E282 Polycystic ovarian syndrome: Secondary | ICD-10-CM | POA: Diagnosis not present

## 2018-09-03 DIAGNOSIS — O368130 Decreased fetal movements, third trimester, not applicable or unspecified: Secondary | ICD-10-CM | POA: Diagnosis not present

## 2018-09-03 DIAGNOSIS — E669 Obesity, unspecified: Secondary | ICD-10-CM | POA: Diagnosis not present

## 2018-09-11 DIAGNOSIS — Z23 Encounter for immunization: Secondary | ICD-10-CM | POA: Diagnosis not present

## 2018-09-14 DIAGNOSIS — R7309 Other abnormal glucose: Secondary | ICD-10-CM | POA: Diagnosis not present

## 2018-09-17 DIAGNOSIS — Z3A30 30 weeks gestation of pregnancy: Secondary | ICD-10-CM | POA: Diagnosis not present

## 2018-09-17 DIAGNOSIS — O24410 Gestational diabetes mellitus in pregnancy, diet controlled: Secondary | ICD-10-CM | POA: Diagnosis not present

## 2018-09-24 DIAGNOSIS — O218 Other vomiting complicating pregnancy: Secondary | ICD-10-CM | POA: Diagnosis not present

## 2018-09-24 DIAGNOSIS — Z3A31 31 weeks gestation of pregnancy: Secondary | ICD-10-CM | POA: Diagnosis not present

## 2018-09-24 DIAGNOSIS — O24410 Gestational diabetes mellitus in pregnancy, diet controlled: Secondary | ICD-10-CM | POA: Diagnosis not present

## 2018-10-06 DIAGNOSIS — O24410 Gestational diabetes mellitus in pregnancy, diet controlled: Secondary | ICD-10-CM | POA: Diagnosis not present

## 2018-10-06 DIAGNOSIS — Z3A33 33 weeks gestation of pregnancy: Secondary | ICD-10-CM | POA: Diagnosis not present

## 2018-10-07 DIAGNOSIS — O26893 Other specified pregnancy related conditions, third trimester: Secondary | ICD-10-CM | POA: Diagnosis not present

## 2018-10-22 DIAGNOSIS — O26843 Uterine size-date discrepancy, third trimester: Secondary | ICD-10-CM | POA: Diagnosis not present

## 2018-10-22 DIAGNOSIS — O24410 Gestational diabetes mellitus in pregnancy, diet controlled: Secondary | ICD-10-CM | POA: Diagnosis not present

## 2018-10-22 DIAGNOSIS — O09893 Supervision of other high risk pregnancies, third trimester: Secondary | ICD-10-CM | POA: Diagnosis not present

## 2018-10-22 DIAGNOSIS — O218 Other vomiting complicating pregnancy: Secondary | ICD-10-CM | POA: Diagnosis not present

## 2018-10-29 DIAGNOSIS — O163 Unspecified maternal hypertension, third trimester: Secondary | ICD-10-CM | POA: Diagnosis not present

## 2018-10-29 DIAGNOSIS — O24410 Gestational diabetes mellitus in pregnancy, diet controlled: Secondary | ICD-10-CM | POA: Diagnosis not present

## 2018-10-29 DIAGNOSIS — Z369 Encounter for antenatal screening, unspecified: Secondary | ICD-10-CM | POA: Diagnosis not present

## 2018-10-30 DIAGNOSIS — O133 Gestational [pregnancy-induced] hypertension without significant proteinuria, third trimester: Secondary | ICD-10-CM | POA: Diagnosis not present

## 2018-10-30 DIAGNOSIS — O163 Unspecified maternal hypertension, third trimester: Secondary | ICD-10-CM | POA: Diagnosis not present

## 2018-10-30 DIAGNOSIS — Z331 Pregnant state, incidental: Secondary | ICD-10-CM | POA: Diagnosis not present

## 2018-10-30 DIAGNOSIS — Z3A36 36 weeks gestation of pregnancy: Secondary | ICD-10-CM | POA: Diagnosis not present

## 2018-10-30 DIAGNOSIS — O24410 Gestational diabetes mellitus in pregnancy, diet controlled: Secondary | ICD-10-CM | POA: Diagnosis not present

## 2018-10-31 DIAGNOSIS — O24420 Gestational diabetes mellitus in childbirth, diet controlled: Secondary | ICD-10-CM | POA: Diagnosis not present

## 2018-10-31 DIAGNOSIS — O1403 Mild to moderate pre-eclampsia, third trimester: Secondary | ICD-10-CM | POA: Diagnosis not present

## 2018-10-31 DIAGNOSIS — K029 Dental caries, unspecified: Secondary | ICD-10-CM | POA: Diagnosis not present

## 2018-10-31 DIAGNOSIS — O1404 Mild to moderate pre-eclampsia, complicating childbirth: Secondary | ICD-10-CM | POA: Diagnosis not present

## 2018-10-31 DIAGNOSIS — O9989 Other specified diseases and conditions complicating pregnancy, childbirth and the puerperium: Secondary | ICD-10-CM | POA: Diagnosis not present

## 2018-10-31 DIAGNOSIS — K047 Periapical abscess without sinus: Secondary | ICD-10-CM | POA: Diagnosis not present

## 2018-10-31 DIAGNOSIS — O99214 Obesity complicating childbirth: Secondary | ICD-10-CM | POA: Diagnosis not present

## 2018-10-31 DIAGNOSIS — E669 Obesity, unspecified: Secondary | ICD-10-CM | POA: Diagnosis not present

## 2018-10-31 DIAGNOSIS — D649 Anemia, unspecified: Secondary | ICD-10-CM | POA: Diagnosis not present

## 2018-10-31 DIAGNOSIS — K0889 Other specified disorders of teeth and supporting structures: Secondary | ICD-10-CM | POA: Diagnosis not present

## 2018-10-31 DIAGNOSIS — Z3A36 36 weeks gestation of pregnancy: Secondary | ICD-10-CM | POA: Diagnosis not present

## 2018-10-31 DIAGNOSIS — O9081 Anemia of the puerperium: Secondary | ICD-10-CM | POA: Diagnosis not present

## 2018-11-02 DIAGNOSIS — Z3A00 Weeks of gestation of pregnancy not specified: Secondary | ICD-10-CM | POA: Diagnosis not present

## 2018-11-02 DIAGNOSIS — Z3A37 37 weeks gestation of pregnancy: Secondary | ICD-10-CM | POA: Diagnosis not present

## 2018-12-15 DIAGNOSIS — Z8632 Personal history of gestational diabetes: Secondary | ICD-10-CM | POA: Diagnosis not present

## 2020-02-02 ENCOUNTER — Emergency Department
Admission: EM | Admit: 2020-02-02 | Discharge: 2020-02-02 | Disposition: A | Discharge status: Home / Self Care | Payer: Medicaid Other | Attending: Emergency Medicine | Admitting: Emergency Medicine

## 2020-02-02 ENCOUNTER — Other Ambulatory Visit: Payer: Self-pay

## 2020-02-02 ENCOUNTER — Emergency Department: Payer: Medicaid Other

## 2020-02-02 DIAGNOSIS — O26891 Other specified pregnancy related conditions, first trimester: Secondary | ICD-10-CM | POA: Diagnosis not present

## 2020-02-02 DIAGNOSIS — O0091 Unspecified ectopic pregnancy with intrauterine pregnancy: Secondary | ICD-10-CM | POA: Insufficient documentation

## 2020-02-02 DIAGNOSIS — M549 Dorsalgia, unspecified: Secondary | ICD-10-CM

## 2020-02-02 DIAGNOSIS — O210 Mild hyperemesis gravidarum: Secondary | ICD-10-CM

## 2020-02-02 DIAGNOSIS — Z3A01 Less than 8 weeks gestation of pregnancy: Secondary | ICD-10-CM | POA: Diagnosis not present

## 2020-02-02 LAB — CBC
HCT: 38.4 % (ref 36.0–46.0)
Hemoglobin: 12.5 g/dL (ref 12.0–15.0)
MCH: 28.2 pg (ref 26.0–34.0)
MCHC: 32.6 g/dL (ref 30.0–36.0)
MCV: 86.5 fL (ref 80.0–100.0)
Platelets: 225 10*3/uL (ref 150–400)
RBC: 4.44 MIL/uL (ref 3.87–5.11)
RDW: 13.7 % (ref 11.5–15.5)
WBC: 5.3 10*3/uL (ref 4.0–10.5)
nRBC: 0 % (ref 0.0–0.2)

## 2020-02-02 LAB — URINALYSIS, COMPLETE (UACMP) WITH MICROSCOPIC
Bilirubin Urine: NEGATIVE
Glucose, UA: NEGATIVE mg/dL
Hgb urine dipstick: NEGATIVE
Ketones, ur: NEGATIVE mg/dL
Leukocytes,Ua: NEGATIVE
Nitrite: NEGATIVE
Protein, ur: NEGATIVE mg/dL
Specific Gravity, Urine: 1.021 (ref 1.005–1.030)
pH: 7 (ref 5.0–8.0)

## 2020-02-02 LAB — BASIC METABOLIC PANEL
Anion gap: 9 (ref 5–15)
BUN: 6 mg/dL (ref 6–20)
CO2: 21 mmol/L — ABNORMAL LOW (ref 22–32)
Calcium: 9.1 mg/dL (ref 8.9–10.3)
Chloride: 105 mmol/L (ref 98–111)
Creatinine, Ser: 0.6 mg/dL (ref 0.44–1.00)
GFR calc Af Amer: >60 mL/min (ref >60)
GFR calc non Af Amer: >60 mL/min (ref >60)
Glucose, Bld: 79 mg/dL (ref 70–99)
Potassium: 4 mmol/L (ref 3.5–5.1)
Sodium: 135 mmol/L (ref 135–145)

## 2020-02-02 LAB — HCG, QUANTITATIVE, PREGNANCY: hCG, Beta Chain, Quant, S: 43338 m[IU]/mL — ABNORMAL HIGH (ref <5)

## 2020-02-02 MED ORDER — METOCLOPRAMIDE HCL 10 MG PO TABS
10.0000 mg | ORAL_TABLET | Freq: Once | ORAL | Status: AC
  Administered 2020-02-02: 10 mg via ORAL
  Filled 2020-02-02: qty 1

## 2020-02-02 MED ORDER — METOCLOPRAMIDE HCL 5 MG PO TABS: ORAL_TABLET | ORAL | 0 refills | Status: AC

## 2020-02-02 NOTE — ED Triage Notes (Signed)
C/o of vomiting with pregnancy. States not keeping liquids down.  Unknown how far a long she is. Maybe 6-7 weeks.   Has OB appt next week. 2nd pregnancy.

## 2020-02-02 NOTE — ED Provider Notes (Signed)
[facility] Emergency Department Provider Note ____________________________________________  Time seen: 1605  I have reviewed the triage vital signs and the nursing notes.  HISTORY  Chief Complaint  Hyperemesis Gravidarum  HPI Krista Drake is a 29 y.o. female G2P1 presents to the ED for management of hyperemesis in the first trimester. She reports 6 w 5d gestation based on her LMP of 12/17/19. She notes she has been unable to keep down water or solids for the last week. She denies fevers, chills, chest pain, dysuria, vaginal bleeding, or pelvic pain. She does give a family history of multiples in pregnancy on both her and her husband's families. Her husband has a set of twins from a prior relationship.   Past Medical History:  Diagnosis Date  . Bell's palsy    during middle school  . Endometriosis   . Family history of breast cancer   . Headache   . Migraine   . Motion sickness    cars    Patient Active Problem List   Diagnosis Date Noted  . Tendinitis 09/25/2016  . Acute tonsillitis 03/30/2015  . Appendicitis, acute 01/17/2015  . Abdominal pain, unspecified site 06/18/2013  . Hx of migraine headaches 06/18/2013    Past Surgical History:  Procedure Laterality Date  . LAPAROSCOPIC APPENDECTOMY N/A 01/17/2015   Procedure: APPENDECTOMY LAPAROSCOPIC;  Surgeon: Tiney Rouge III, MD;  Location: [facility] ORS;  Service: General;  Laterality: N/A;  . LAPAROSCOPY  08/12/13  . TONSILLECTOMY AND ADENOIDECTOMY N/A 11/17/2015   Procedure: TONSILLECTOMY AND ADENOIDECTOMY;  Surgeon: Linus Salmons, MD;  Location: [facility];  Service: ENT;  Laterality: N/A;  . WISDOM TOOTH EXTRACTION      Prior to Admission medications   Medication Sig Start Date End Date Taking? Authorizing Provider  aspirin-acetaminophen-caffeine (EXCEDRIN MIGRAINE) [phone] MG tablet Take 2 tablets by mouth every 6 (six) hours as needed for headache or migraine.    [provider]   Aspirin-Acetaminophen-Caffeine (GOODY HEADACHE PO) Take 1 Package by mouth as needed (headache).    [provider]  metoCLOPramide (REGLAN) 5 MG tablet Take 1-2 tabs TID prn N/V 02/02/20   Harlea Goetzinger, Charlesetta Ivory, PA-C    Allergies Caramel, Zoloft [sertraline hcl], and Sertraline  Family History  Problem Relation Age of Onset  . Anemia Mother   . Diabetes Maternal Grandmother   . Diabetes Maternal Grandfather   . Breast cancer Paternal Grandmother 106  . Breast cancer Other 42    Social History Social History   Tobacco Use  . Smoking status: Never Smoker  . Smokeless tobacco: Never Used  Vaping Use  . Vaping Use: Never used  Substance Use Topics  . Alcohol use: Yes    Comment: 2 glass wine/month  . Drug use: No    Review of Systems  Constitutional: Negative for fever. Cardiovascular: Negative for chest pain. Respiratory: Negative for shortness of breath. Gastrointestinal: Negative for abdominal pain, and diarrhea. Reports hyperemesis. Genitourinary: Negative for dysuria. Musculoskeletal: Negative for back pain. Skin: Negative for rash. Neurological: Negative for headaches, focal weakness or numbness. ____________________________________________  PHYSICAL EXAM:  VITAL SIGNS: ED Triage Vitals [02/02/20 1321]  Enc Vitals Group     BP (!) 125/98     Pulse Rate 76     Resp 16     Temp 98.7 F (37.1 C)     Temp Source Oral     SpO2 99 %     Weight 230 lb (104.3 kg)  Height 5\' 7"  (1.702 m)     Head Circumference      Peak Flow      Pain Score 9     Pain Loc      Pain Edu?      Excl. in GC?     Constitutional: Alert and oriented. Well appearing and in no distress. Head: Normocephalic and atraumatic. Eyes: Conjunctivae are normal. Normal extraocular movements Cardiovascular: Normal rate, regular rhythm. Normal distal pulses. Respiratory: Normal respiratory effort. No wheezes/rales/rhonchi. Gastrointestinal: Soft, gravid and nontender. No  distention. No CVA tenderness noted.  Musculoskeletal: Nontender with normal range of motion in all extremities.  Neurologic:  Normal gait without ataxia. Normal speech and language. No gross focal neurologic deficits are appreciated. Skin:  Skin is warm, dry and intact. No rash noted. Psychiatric: Mood and affect are normal. Patient exhibits appropriate insight and judgment. ____________________________________________   LABS (pertinent positives/negatives) Labs Reviewed  BASIC METABOLIC PANEL - Abnormal; Notable for the following components:      Result Value   CO2 21 (*)    All other components within normal limits  HCG, QUANTITATIVE, PREGNANCY - Abnormal; Notable for the following components:   hCG, Beta Chain, Quant, S (*)    All other components within normal limits  URINALYSIS, COMPLETE (UACMP) WITH MICROSCOPIC - Abnormal; Notable for the following components:   Color, Urine YELLOW (*)    APPearance HAZY (*)    Bacteria, UA RARE (*)    All other components within normal limits  CBC  ____________________________________________   RADIOLOGY  OB <14 weeks w/ Transvaginal  IMPRESSION: Single viable intrauterine pregnancy as above. No specific abnormality is seen. ____________________________________________  PROCEDURES  Reglan 10 mg PO PO Challenge - tolerated w/o emesis  Procedures ____________________________________________  INITIAL IMPRESSION / ASSESSMENT AND PLAN / ED COURSE  DDX: ectopic, IUP, hyperemesis gravidarum  Female patient with ED evaluation of hyperemesis for the last week.  Patient found out 2 weeks ago that she was pregnant after she had missed her last menstrual period she is scheduled to see an OB provider next week.  She denies any other symptoms except being unable to keep down liquids and/or solids at this time.  She denies any fever, chills, sweats, or vaginal bleeding.  Exam is overall benign reassuring at this time.  Labs also  reassuring at this showed no acute dehydration or infectious process.  No indication of UTI on exam.  Ultrasound is also reassuring as it shows a single IUP with EDC consistent with LMP.  Patient will be discharged with a prescription for Reglan to take as needed for hyperemesis.  She has tolerated a dose in the ED as well as an oral challenge without subsequent emesis.  She will follow-up with her primary OB provider return to the ED as necessary.  08,657 was evaluated in Emergency Department on 02/02/2020 for the symptoms described in the history of present illness. She was evaluated in the context of the global COVID-19 pandemic, which necessitated consideration that the patient might be at risk for infection with the SARS-CoV-2 virus that causes COVID-19. Institutional protocols and algorithms that pertain to the evaluation of patients at risk for COVID-19 are in a state of rapid change based on information released by regulatory bodies including the CDC and federal and state organizations. These policies and algorithms were followed during the patient's care in the ED. ____________________________________________  FINAL CLINICAL IMPRESSION(S) / ED DIAGNOSES  Final diagnoses:  Hyperemesis gravidarum  Back  pain affecting pregnancy      Lissa Hoard, PA-C 02/02/20 1946    Phineas Semen, MD 02/02/20 2102

## 2020-02-02 NOTE — Discharge Instructions (Addendum)
Your exam and [place] are normal at this time. Take the nausea medicine as directed. Follow-up with your provider for routine care.

## 2020-02-02 NOTE — ED Notes (Signed)
See triage note  Presents with n/v  States she is not able to keep any thing down   No fever  Denies any vaginal bleeding

## 2021-05-30 LAB — HEMOGLOBIN A1C
Estimated Avg Glucose, External: 113 mg/dL (ref 91–123)
Hemoglobin A1C, External: 5.6 % (ref 4.8–5.6)

## 2021-09-13 ENCOUNTER — Inpatient Hospital Stay: Admit: 2021-09-13 | Payer: MEDICAID

## 2021-09-13 DIAGNOSIS — M5459 Other low back pain: Secondary | ICD-10-CM

## 2021-09-13 NOTE — Progress Notes (Signed)
PT DAILY TREATMENT NOTE/ LUMBAR EVAL 10-18      Patient Name: Nicole Rogers    Date: 09/13/2021    DOB: August 04, 1990  Insurance: Payor: [employer] / Plan: [employer] / Product Type: *No Product type* /      Patient DOB verified yes     Visit #   Current / Total 1 16   Time   In / Out [id number]   Pain   In / Out 7 7     TREATMENT AREA =  Other low back pain [M54.59]      SUBJECTIVE  Pain Level (0-10 scale): 7/10  [] constant [] intermittent [] improving [] worsening [] no change since onset    Any medication changes, allergies to medications, adverse drug reactions, diagnosis change, or new procedure performed?: [x]  No    []  Yes (see summary sheet for update)  Subjective functional status/changes:     PLOF: functionally independent, no AD, active lifestyle mom of 2 under 2  Limitations to PLOF: difficulty with sitting,   Mechanism of Injury: Pt is [redacted] weeks pregnant.  Pt reported that at about [redacted] weeks pregnant she has felt a nerve shooting down and feels like she will fall. Pt also reports low back pain and bil hip pain.  Pt has recently also pulled her left abdomen.   Current symptoms/Complaints: 6/10 at best with belly band; 10/10 at worst with sitting nad laying down; pt reports they are unable to sleep through the night secondary to pain  Previous Treatment/Compliance: belly band   PMHx/Surgical Hx: gestational diabetes, hx preclampsia with first son, HTN, vaginal births delivered at 37 weeks, second child had increased fluid around him   Work Hx: works a seated job   Living Situation: 2 story home with 4 STE and 1 flight to bedroom   Pt Goals: "to try to learn how to improve my back pain, stick it out and have the baby, learn what I can do at home to help with the nerve pain I am having "  Barriers: [x] pain [] financial [x] time [] transportation [] other  Motivation: good   Substance use: [] Alcohol [] Tobacco [] other:   Cognition: A & O x 3        OBJECTIVE    29 min [] Eval - untimed                          29 0 MC BC  Totals Reminder: bill using total billable min of TIMED therapeutic procedures (example: do not include dry needle or estim unattended, both untimed codes, in totals to left)  8-22 min = 1 unit; 23-37 min = 2 units; 38-52 min = 3 units; 53-67 min = 4 units; 68-82 min = 5 units   Total Total     [x]   Patient Education billed concurrently with other procedures   [x]  Review HEP    []  Progressed/Changed HEP, detail:    []  Other detail:       General Evaluation    Physical Therapy Evaluation - Lumbar Spine    OBJECTIVE  Posture:  Lateral Shift: []  Right    [x]  Left     [x]  +  []  -  Kyphosis: []  Increased []  Decreased   [x]   WNL  Lordosis:  [x]  Increased []  Decreased   []  WNL  Pelvic symmetry: [x]  WNL    []  Other:    Gait:  []  Normal     [x]  Abnormal: wide BOS short step length  Active Movements: []  N/A   []  Too acute   []  Other:  ROM % AROM Comments:pain, area   Forward flexion 40-60 28 cm to floor LBP   Extension 20-30 25% limited LBP   SideBend right 20-30 46 LBP r   SideBend left 20-30 47    Rotation right 5-10 WFL LBP   Rotation left 5-10 WFL LBP     Strength   Left(0-5) Right (0-5) N/T   Hip Flexion (L1,2) 5 4+ []    Knee Extension (L3,4) 5 5 []    Ankle Dorsiflexion (L4) 5 5 []    Gluteus Maximus 3 3+ []    Hip Abduction 2 2+ []      Special Tests  Active Straight Leg Raise Test  Difficulty rated 0-5  Right: 4  Left: 5    Other Tests / Comments: high levels of pain with all hip motions, difficulty laying on back, hamstring tightness, Piriformis tightness      Pain Level (0-10 scale) post treatment: 7/10    ASSESSMENT/Changes in Function: 31 yo female who presents to [facility] with c/o low back pain bil hip pain and right radicular pain. Patient reports that pain started about 12 weeks pregancy and reported that low back pain and hip pain is the worst of the pain.  She reports that every now and then she has pain that is sharp that will shoot down right leg and she will stumble.  Pt does report fear of falling when  that happens.  Patient reports significant PMH of gestational diabetes, hx preclampsia with first son, HTN, vaginal births delivered at 37 weeks, second child had increased fluid around him. Patient demonstrates decreased ROM, decreased strength, impaired posture, impaired gait mechancis, pain and decreased functional mobility tolerance.    Patient will continue to benefit from skilled PT services to modify and progress therapeutic interventions, address functional mobility deficits, address ROM deficits, address strength deficits, analyze and address soft tissue restrictions, analyze and cue movement patterns, analyze and modify body mechanics/ergonomics, assess and modify postural abnormalities, address imbalance/dizziness, and instruct in home and community integration to attain remaining goals.     [x]   See Plan of Care  []   See progress note/recertification  []   See Discharge Summary         Progress towards goals / Updated goals:  Short Term Goals: To be accomplished in 4 treatments:  Patient will be independent and compliant with HEP to progress toward goals and restore functional mobility.   Eval Status: issued at eval    Patient will improve FOTO score by 10 points to improve functional tolerance for exercise.   Eval Status: FOTO 19  FOTO score = an established functional score where 100 = no disability    Patient will improve pain in low back to 5/10  to improve activity tolerance and restore prior level of function.  Eval Status: 10/10 at worst    Pt will have 3/5 hip abduction strength to return to goals of improved gait pattern and decreased back pain.  Eval Status:    Left(0-5) Right (0-5)   Hip Flexion (L1,2) 5 4+   Knee Extension (L3,4) 5 5   Ankle Dorsiflexion (L4) 5 5   Gluteus Maximus 3 3+   Hip Abduction 2 2+       Pt will have painfree lumbar AROM WFL to aid in functional mechanics for ambulation/ADLs.  Eval Status:   ROM % AROM Comments:pain, area   Forward flexion 40-60 28 cm to  floor LBP    Extension 20-30 25% limited LBP   SideBend right 20-30 46 LBP r   SideBend left 20-30 47    Rotation right 5-10 WFL LBP   Rotation left 5-10 WFL LBP       Long Term Goals: To be accomplished in 12 treatments:  Patient will improve FOTO score by 29 points to improve functional tolerance for standing and sitting tolerance.  Eval Status: FOTO 19  FOTO score = an established functional score where 100 = no disability    2.   Pt will be able to sit for 2 hours with out increased pain in order to return to normal work duties.    Eval Status: increased pain with 30 min of sitting     3.   Pt will have 5/5 bil strength to return to goals of improved gait pattern and improved low back pain.  Eval Status:    Left(0-5) Right (0-5)   Hip Flexion (L1,2) 5 4+   Knee Extension (L3,4) 5 5   Ankle Dorsiflexion (L4) 5 5   Gluteus Maximus 3 3+   Hip Abduction 2 2+       4.   Patient will improve pain in low back to 1-2/10 at worst to improve standing and walking tolerance and restore prior level of function.  Eval Status: 10/10 at worst    5.  Patient will be able to perform ASLR test with 1 grade improvement in order to improve core strength needed for sitting and standing.   Eval status: Active Straight Leg Raise Test  Difficulty rated 0-5  Right: 4  Left: 5      PLAN  [x]   Upgrade activities as tolerated     [x]   Continue plan of care  [x]   Update interventions per flow sheet       []   Discharge due to:_  []   Other:_      , PT 09/13/2021  5:22 PM

## 2021-09-13 NOTE — Progress Notes (Signed)
[facility]  [address] [place], [place] [address]  Ph [phone]  Fx [phone]    Plan of Care/ Statement of Necessity for Physical Therapy Services      Patient name: Nicole Rogers Start of Care: 09/13/2021   Referral source: Gardiner Ramus, NP-C DOB: 09/24/1990    Medical Diagnosis: Other low back pain [M54.59]   Onset Date: November 2022   Treatment Diagnosis: low back pain    Prior Hospitalization: see medical history Provider#: [id number]   Medications: Verified on Patient summary List   Comorbidities: gestational diabetes, hx preclampsia with first son, HTN, vaginal births delivered at 37 weeks, second child had increased fluid around him   Prior Level of Function: functionally independent, no AD, active lifestyle mom of 2 under 2      The Plan of Care and following information is based on the information from the initial evaluation.  Assessment/ key information: 31 yo female who presents to [facility] with c/o low back pain bil hip pain and right radicular pain. Patient reports that pain started about 12 weeks pregancy and reported that low back pain and hip pain is the worst of the pain.  She reports that every now and then she has pain that is sharp that will shoot down right leg and she will stumble.  Pt does report fear of falling when that happens.  Patient reports significant PMH of gestational diabetes, hx preclampsia with first son, HTN, vaginal births delivered at 37 weeks, second child had increased fluid around him. Patient demonstrates decreased ROM, decreased strength, impaired posture, impaired gait mechancis, pain and decreased functional mobility tolerance.     Patient will continue to benefit from skilled PT services to modify and progress therapeutic interventions, address functional mobility deficits, address ROM deficits, address strength deficits, analyze and address soft tissue restrictions, analyze and cue movement patterns, analyze and modify body  mechanics/ergonomics, assess and modify postural abnormalities, address imbalance/dizziness, and instruct in home and community integration to attain remaining goals.  Evaluation Complexity HistoryHIGH Complexity :3+ comorbidities / personal factors will impact the outcome/ POC  ; Examination HIGH Complexity : 4+ Standardized tests and measures addressing body structure, function, activity limitation and / or participation in recreation  ;Presentation MEDIUM Complexity : Evolving with changing characteristics  ;Clinical Decision Making MEDIUM Complexity : FOTO score of 26-74 FOTO score = an established functional score where 100 = no disability  Overall Complexity Rating: MEDIUM  Problem List: pain affecting function, decrease ROM, decrease strength, edema affection function, impaired gait/balance, decrease ADL/functional abilities, decrease activity tolerance, decrease flexibility/joint mobility, and decrease transfer abilities    Treatment Plan may include any combination of the following: 38466 Therapeutic Exercise, 97112 Neuromuscular Re-Education, 97140 Manual Therapy, 97530 Therapeutic Activity, 97535 Self Care/Home Management, 97014 Electrical Stim unattended, [phone] Electrical Stim attended, [phone] Vasopneumatic Device, [phone] Aquatic Therapy, and [phone] Gait Training  Patient / Family readiness to learn indicated by: asking questions, trying to perform skills, and interest  Persons(s) to be included in education: patient (P)  Barriers to Learning/Limitations: None  Measures taken if barriers to learning present: NA  Patient Goal (s): ???to try to learn how to improve my back pain, stick it out and have the baby, learn what I can do at home to help with the nerve pain I am having ???  Patient Self Reported Health Status: poor  Rehabilitation Potential: good    Short Term Goals: To be accomplished in 4  treatments:  Patient will be independent and compliant with HEP to progress toward goals and restore functional  mobility.   Eval Status: issued at eval     Patient will improve FOTO score by 10 points to improve functional tolerance for exercise.   Eval Status: FOTO 19  FOTO score = an established functional score where 100 = no disability     Patient will improve pain in low back to 5/10  to improve activity tolerance and restore prior level of function.  Eval Status: 10/10 at worst     Pt will have 3/5 hip abduction strength to return to goals of improved gait pattern and decreased back pain.  Eval Status:     Left(0-5) Right (0-5)   Hip Flexion (L1,2) 5 4+   Knee Extension (L3,4) 5 5   Ankle Dorsiflexion (L4) 5 5   Gluteus Maximus 3 3+   Hip Abduction 2 2+         Pt will have painfree lumbar AROM WFL to aid in functional mechanics for ambulation/ADLs.  Eval Status:   ROM % AROM Comments:pain, area   Forward flexion 40-60 28 cm to floor LBP   Extension 20-30 25% limited LBP   SideBend right 20-30 46 LBP r   SideBend left 20-30 47     Rotation right 5-10 WFL LBP   Rotation left 5-10 WFL LBP         Long Term Goals: To be accomplished in 12 treatments:  Patient will improve FOTO score by 29 points to improve functional tolerance for standing and sitting tolerance.  Eval Status: FOTO 19  FOTO score = an established functional score where 100 = no disability     2.   Pt will be able to sit for 2 hours with out increased pain in order to return to normal work duties.    Eval Status: increased pain with 30 min of sitting      3.   Pt will have 5/5 bil strength to return to goals of improved gait pattern and improved low back pain.  Eval Status:     Left(0-5) Right (0-5)   Hip Flexion (L1,2) 5 4+   Knee Extension (L3,4) 5 5   Ankle Dorsiflexion (L4) 5 5   Gluteus Maximus 3 3+   Hip Abduction 2 2+         4.   Patient will improve pain in low back to 1-2/10 at worst to improve standing and walking tolerance and restore prior level of function.  Eval Status: 10/10 at worst     5.  Patient will be able to perform ASLR test with 1  grade improvement in order to improve core strength needed for sitting and standing.   Eval status: Active Straight Leg Raise Test  Difficulty rated 0-5  Right: 4  Left: 5    Frequency / Duration: Patient to be seen 1-2 times per week for 16 treatments    Patient/ Caregiver education and instruction: Diagnosis, prognosis, self care, activity modification, brace/ splint application, and exercises     [x]   Plan of care has been reviewed with PTA    Certification Period: NA  , PT 09/13/2021 5:20 PM  ____________________________________________________________________  I certify that the above Therapy Services are being furnished while the patient is under my care. I agree with the treatment plan and certify that this therapy is necessary.    Physician's Signature:____________Date:_________TIME:________  Heins, Belenda Cruise, NP-C    [employer], Date and Time must be completed for valid certification **    [facility]  [address] [place], [place] [address]  Ph [phone]  Fx [phone]

## 2021-09-19 ENCOUNTER — Inpatient Hospital Stay: Admit: 2021-09-19 | Payer: MEDICAID

## 2021-09-19 NOTE — Progress Notes (Signed)
PHYSICAL / OCCUPATIONAL THERAPY - DAILY TREATMENT NOTE (updated 2/23)    Patient Name: Nicole Rogers    Date: 09/19/2021    DOB: [birth year]  Insurance: Payor: [employer]ICA[employer] / Plan: [employer] MEDICAID / Product Type: *No Product type* /      Patient DOB verified Yes     Visit #   Current / Total 2 16   Time   In / Out 825 915   Pain   In / Out 6 7.5   Subjective Functional Status/Changes: Pt reported that she has felt some improvement in pain levels    Changes to:  Meds, Allergies, Med Hx, Sx Hx?  If yes, update Summary List no       TREATMENT AREA =  Other low back pain [M54.59]    OBJECTIVE    Therapeutic Procedures:  Tx Min Billable or 1:1 Min (if diff from Tx Min) Procedure, Rationale, Specifics   20 20 97110 Therapeutic Exercise (timed):  increase ROM, strength, coordination, balance, and proprioception to improve patient's ability to progress to PLOF and address remaining functional goals. (see flow sheet as applicable)     Details if applicable:       15 15 97112 Neuromuscular Re-Education (timed):  improve balance, coordination, kinesthetic sense, posture, core stability and proprioception to improve patient's ability to develop conscious control of individual muscles and awareness of position of extremities in order to progress to PLOF and address remaining functional goals. (see flow sheet as applicable)     Details if applicable:     15 15 97530 Therapeutic Activity (timed):  use of dynamic activities replicating functional movements to increase ROM, strength, coordination, balance, and proprioception in order to improve patient's ability to progress to PLOF and address remaining functional goals.  (see flow sheet as applicable)     Details if applicable:     50 50 MC BC Totals Reminder: bill using total billable min of TIMED therapeutic procedures (example: do not include dry needle or estim unattended, both untimed codes, in totals to left)  8-22 min = 1 unit; 23-37 min = 2 units; 38-52 min = 3 units; 53-67  min = 4 units; 68-82 min = 5 units   Total Total     TOTAL TREATMENT TIME:        50     [x]   Patient Education billed concurrently with other procedures   [x]  Review HEP    []  Progressed/Changed HEP, detail:    []  Other detail:       Objective Information/Functional Measures/Assessment    Patient tolerated treatment session fairly today. Patient had no complaints with addition of Swiss ball exercises to exercise program to accomplish improved pelvic mobility and improved back pain. Pt continues to need increased cueing for TA.  Patient continues to make steady progress toward goals and would benefit from continued skilled PT intervention to address remaining deficits outlined in goals below.    Patient will continue to benefit from skilled PT / OT services to modify and progress therapeutic interventions, analyze and address functional mobility deficits, analyze and address ROM deficits, analyze and address strength deficits, analyze and address soft tissue restrictions, analyze and cue for proper movement patterns, analyze and modify for postural abnormalities, analyze and address imbalance/dizziness, and instruct in home and community integration to address functional deficits and attain remaining goals.    Progress toward goals / Updated goals:  []   See Progress Note/Recertification    Short Term Goals: To be accomplished  in 4 treatments:  Patient will be independent and compliant with HEP to progress toward goals and restore functional mobility.   Eval Status: issued at eval     Patient will improve FOTO score by 10 points to improve functional tolerance for exercise.   Eval Status: FOTO 19  FOTO score = an established functional score where 100 = no disability     Patient will improve pain in low back to 5/10  to improve activity tolerance and restore prior level of function.  Eval Status: 10/10 at worst  Current: 9/10 at worst 09/19/21     Pt will have 3/5 hip abduction strength to return to goals of improved  gait pattern and decreased back pain.  Eval Status:     Left(0-5) Right (0-5)   Hip Flexion (L1,2) 5 4+   Knee Extension (L3,4) 5 5   Ankle Dorsiflexion (L4) 5 5   Gluteus Maximus 3 3+   Hip Abduction 2 2+         Pt will have painfree lumbar AROM WFL to aid in functional mechanics for ambulation/ADLs.  Eval Status:   ROM % AROM Comments:pain, area   Forward flexion 40-60 28 cm to floor LBP   Extension 20-30 25% limited LBP   SideBend right 20-30 46 LBP r   SideBend left 20-30 47     Rotation right 5-10 WFL LBP   Rotation left 5-10 WFL LBP         Long Term Goals: To be accomplished in 12 treatments:  Patient will improve FOTO score by 29 points to improve functional tolerance for standing and sitting tolerance.  Eval Status: FOTO 19  FOTO score = an established functional score where 100 = no disability     2.   Pt will be able to sit for 2 hours with out increased pain in order to return to normal work duties.    Eval Status: increased pain with 30 min of sitting      3.   Pt will have 5/5 bil strength to return to goals of improved gait pattern and improved low back pain.  Eval Status:     Left(0-5) Right (0-5)   Hip Flexion (L1,2) 5 4+   Knee Extension (L3,4) 5 5   Ankle Dorsiflexion (L4) 5 5   Gluteus Maximus 3 3+   Hip Abduction 2 2+         4.   Patient will improve pain in low back to 1-2/10 at worst to improve standing and walking tolerance and restore prior level of function.  Eval Status: 10/10 at worst     5.  Patient will be able to perform ASLR test with 1 grade improvement in order to improve core strength needed for sitting and standing.   Eval status: Active Straight Leg Raise Test  Difficulty rated 0-5  Right: 4  Left: 5      PLAN  Yes  Continue plan of care  []   Upgrade activities as tolerated  []   Discharge due to :  []   Other:    Leeroy BockAmanda Maxi Rodas, PT    09/19/2021    8:54 AM    Future Appointments   Date Time Provider Department Center   09/25/2021  9:00 AM Janene HarveyJessica K MaalaeaHollowell, PT [facility]    09/26/2021 10:00 AM Era SkeenLiana Gensler, PTA [facility]   10/02/2021 10:00 AM Desmond DikeJessica K Hollowell, PT [facility]   10/03/2021  9:30 AM Desmond DikeJessica K Hollowell, PT [facility]   10/09/2021  8:00 AM Marna Adsit, PT [facility]   10/10/2021  9:30 AM Janene Harvey Hollowell, PT [facility]   10/16/2021  9:00 AM Desmond Dike, PT [facility]   10/18/2021  6:00 PM Desmond Dike, PT [facility]   10/23/2021  9:00 AM Desmond Dike, PT [facility]   10/25/2021  6:00 PM Janene Harvey Twining, PT [facility]

## 2021-09-25 ENCOUNTER — Inpatient Hospital Stay: Payer: MEDICAID

## 2021-09-25 NOTE — Telephone Encounter (Signed)
NS/NC - VM was full, so couldn't leave message

## 2021-09-26 ENCOUNTER — Inpatient Hospital Stay: Admit: 2021-09-26 | Payer: MEDICAID

## 2021-09-26 NOTE — Progress Notes (Signed)
PHYSICAL / OCCUPATIONAL THERAPY - DAILY TREATMENT NOTE (updated 2/23)    Patient Name: Nicole Rogers    Date: 09/26/2021    DOB: 03/16/1991  Insurance: Payor: [employer] MEDICAID / Plan: [employer] MEDICAID / Product Type: *No Product type* /      Patient DOB verified Yes     Visit #   Current / Total 3 16   Time   In / Out 845 826   Pain   In / Out 7/10 back 5/10 leg 5-6/10   Subjective Functional Status/Changes: Pt reported that she feels like the exercises dont help because she goes right back to sitting.  Pt returns to MD next week.   Changes to:  Meds, Allergies, Med Hx, Sx Hx?  If yes, update Summary List no       TREATMENT AREA =  Other low back pain [M54.59]    OBJECTIVE    Therapeutic Procedures:  Tx Min Billable or 1:1 Min (if diff from Tx Min) Procedure, Rationale, Specifics   13 13 97110 Therapeutic Exercise (timed):  increase ROM, strength, coordination, balance, and proprioception to improve patient's ability to progress to PLOF and address remaining functional goals. (see flow sheet as applicable)     Details if applicable:       20 20 97112 Neuromuscular Re-Education (timed):  improve balance, coordination, kinesthetic sense, posture, core stability and proprioception to improve patient's ability to develop conscious control of individual muscles and awareness of position of extremities in order to progress to PLOF and address remaining functional goals. (see flow sheet as applicable)     Details if applicable:     8 8 97535 Self Care/Home Management (timed):  improve patient knowledge and understanding of pain reducing techniques  to improve patient's ability to progress to PLOF and address remaining functional goals.  (see flow sheet as applicable)     Details if applicable:  positioning for sleeping    41 41 MC BC Totals Reminder: bill using total billable min of TIMED therapeutic procedures (example: do not include dry needle or estim unattended, both untimed codes, in totals to left)  8-22 min = 1 unit;  23-37 min = 2 units; 38-52 min = 3 units; 53-67 min = 4 units; 68-82 min = 5 units   Total Total     TOTAL TREATMENT TIME:        41     [x]   Patient Education billed concurrently with other procedures   [x]  Review HEP    []  Progressed/Changed HEP, detail:    []  Other detail:       Objective Information/Functional Measures/Assessment    Patient tolerated treatment session well today. Patient reported continued problems with sleeping.  PT gave instructions for sleeping and pillow use.  Pt also reported that exercises feel like they dont help due to having to sit for so long.  PT encouraged pt to try to stand every hour for 30s to 1 min just to stretch everything out.  PT spent a significant amount of time working on PPT and TA contraction paired with out breath in order to activate right.  Pt was able to contract with good technique at end of visit so PT did not add any new exercises today.  Patient continues to make steady progress toward goals and would benefit from continued skilled PT intervention to address remaining deficits outlined in goals below.      Patient will continue to benefit from skilled PT / OT services to  modify and progress therapeutic interventions, analyze and address functional mobility deficits, analyze and address ROM deficits, analyze and address strength deficits, analyze and address soft tissue restrictions, analyze and cue for proper movement patterns, analyze and modify for postural abnormalities, and instruct in home and community integration to address functional deficits and attain remaining goals.    Progress toward goals / Updated goals:  []   See Progress Note/Recertification    Short Term Goals: To be accomplished in 4 treatments:  Patient will be independent and compliant with HEP to progress toward goals and restore functional mobility.   Eval Status: issued at eval     Patient will improve FOTO score by 10 points to improve functional tolerance for exercise.   Eval Status: FOTO  19  FOTO score = an established functional score where 100 = no disability     Patient will improve pain in low back to 5/10  to improve activity tolerance and restore prior level of function.  Eval Status: 10/10 at worst  Current: 10/10 when her back spasm at night 09/26/21     Pt will have 3/5 hip abduction strength to return to goals of improved gait pattern and decreased back pain.  Eval Status:     Left(0-5) Right (0-5)   Hip Flexion (L1,2) 5 4+   Knee Extension (L3,4) 5 5   Ankle Dorsiflexion (L4) 5 5   Gluteus Maximus 3 3+   Hip Abduction 2 2+         Pt will have painfree lumbar AROM WFL to aid in functional mechanics for ambulation/ADLs.  Eval Status:   ROM % AROM Comments:pain, area   Forward flexion 40-60 28 cm to floor LBP   Extension 20-30 25% limited LBP   SideBend right 20-30 46 LBP    SideBend left 20-30 47     Rotation right 5-10 WFL LBP   Rotation left 5-10 WFL LBP         Long Term Goals: To be accomplished in 12 treatments:  Patient will improve FOTO score by 29 points to improve functional tolerance for standing and sitting tolerance.  Eval Status: FOTO 19  FOTO score = an established functional score where 100 = no disability     2.   Pt will be able to sit for 2 hours with out increased pain in order to return to normal work duties.    Eval Status: increased pain with 30 min of sitting      3.   Pt will have 5/5 bil strength to return to goals of improved gait pattern and improved low back pain.  Eval Status:     Left(0-5) Right (0-5)   Hip Flexion (L1,2) 5 4+   Knee Extension (L3,4) 5 5   Ankle Dorsiflexion (L4) 5 5   Gluteus Maximus 3 3+   Hip Abduction 2 2+         4.   Patient will improve pain in low back to 1-2/10 at worst to improve standing and walking tolerance and restore prior level of function.  Eval Status: 10/10 at worst  Current: 10/10 when her back spasm at night 09/26/21     5.  Patient will be able to perform ASLR test with 1 grade improvement in order to improve core strength  needed for sitting and standing.   Eval status: Active Straight Leg Raise Test  Difficulty rated 0-5  Right: 4  Left: 5      PLAN  Yes  Continue plan of care  []   Upgrade activities as tolerated  []   Discharge due to :  []   Other:    Leeroy BockAmanda Saliha Salts, PT    09/26/2021    7:31 AM    Future Appointments   Date Time Provider Department Center   10/02/2021 10:00 AM Janene HarveyJessica K CorydonHollowell, PT [facility]   10/03/2021  8:30 AM Leeroy BockAmanda Marquavius Scaife, PT [facility]   10/09/2021  8:00 AM Marna Adsit, PT [facility]   10/10/2021  7:30 AM Marna Adsit, PT [facility]   10/16/2021  9:00 AM Janene HarveyJessica K Hollowell, PT [facility]   10/18/2021  6:00 PM Desmond DikeJessica K Hollowell, PT [facility]   10/23/2021  9:00 AM Desmond DikeJessica K Hollowell, PT [facility]   10/25/2021  6:00 PM Janene HarveyJessica K Hollowell, PT [facility]

## 2021-09-29 ENCOUNTER — Inpatient Hospital Stay: Payer: MEDICAID

## 2021-09-29 LAB — POCT URINALYSIS DIPSTICK
Bilirubin, Urine, POC: NEGATIVE
Blood, UA POC: NEGATIVE
Glucose, UA POC: NEGATIVE mg/dL
Ketones, Urine, POC: NEGATIVE mg/dL
Leukocyte Est, UA POC: NEGATIVE
Nitrite, Urine, POC: NEGATIVE
Specific Gravity, Urine, POC: 1.025 — ABNORMAL HIGH (ref 1.001–1.023)
URINE UROBILINOGEN POC: 0.2 EU/dL (ref 0.2–1.0)
pH, Urine, POC: 6 (ref 5.0–9.0)

## 2021-09-29 LAB — POCT GLUCOSE: POC Glucose: 114 mg/dL — ABNORMAL HIGH (ref 70–110)

## 2021-09-29 MED ORDER — ONDANSETRON 4 MG PO TBDP
4 MG | Freq: Three times a day (TID) | ORAL | Status: DC | PRN
Start: 2021-09-29 — End: 2021-09-29

## 2021-09-29 MED ORDER — ONDANSETRON HCL 4 MG/2ML IJ SOLN
4 MG/2ML | Freq: Four times a day (QID) | INTRAMUSCULAR | Status: DC | PRN
Start: 2021-09-29 — End: 2021-09-29

## 2021-09-29 MED ORDER — SODIUM CHLORIDE 0.9 % IV BOLUS
0.9 % | Freq: Once | INTRAVENOUS | Status: AC
Start: 2021-09-29 — End: 2021-09-29
  Administered 2021-09-29: 20:00:00 1000 mL via INTRAVENOUS

## 2021-09-29 MED FILL — SODIUM CHLORIDE 0.9 % IV SOLN: 0.9 % | INTRAVENOUS | Qty: 1000

## 2021-09-29 NOTE — Progress Notes (Addendum)
Patient presents with complaint of headache, pain 8/10, elevated blood sugar, and swelling. Patient is G3P2, 32 weeks and 2 days. Abdomen soft, toco and ultrasound placed. Triage assessment performed.     1653 Stacy, SNM, at bedside. Plan of care discussed and diabetic education given.     1740 Discharge instructions given, all questions answered.

## 2021-09-29 NOTE — Progress Notes (Signed)
Triage Progress Note      Patient presented to triage with reports of headache and fasting blood sugar of 298.     Physical Exam:  NAD  Cervical Exam:  deferred  Membranes:  intact  Uterine Activity: none per external monitor. Abdomen palpates soft.  Fetal Heart Rate: 140bpm  Variability: moderate (following NS bolus)  Accelerations: present (following NS bolus)  Decelerations: none (following NS bolus)    DTRs2+, no clonus present     Assessment/Plan:    HA resolved with NS bolus; discussed importance of hydration throughout the day. Use of tylenol to alleviate minor discomforts is appropriate and encouraged. Reviewed s/sx of pre-eclampsia.    BS @ bedside (1534) was 114mg /dL, asymptomatic. Discussed diet history, reviewed blood sugar log and provided pt with EVMS educational materials (reviewing diagnosis, diet/carohydrate management and example diet plans). Discussed importance of taking fasting immediately upon rising for accurate data and with breakfast immediately to follow. Reviewed effects of high blood sugar on pregnancy and fetus (polyhydramnios, excessive fetal growth and hypoglycemia in the newborn). Encouraged pt to maintain euglycemic state through the remainder of the pregnancy to provide benefits prior to birth.     Reassuring fetal status, no s/s of labor. Plan d/c home with office appt scheduled with Dr. this Monday.    Tuesday, SNM

## 2021-09-29 NOTE — Discharge Instructions (Signed)
Prenatal Discharge Instructions  Follow up appointment: Ensure to keep your scheduled OB appointment with your OB     Diet: Diabetic Diet as tolerated, ensure to drink plenty of fluids especially water for hydration     Activity: As tolerated, elevate, legs and feet while sitting to help keep swelling to a minimum     Medications: As prescribed     Call your physician or return to Labor and Delivery if any of the following symptoms occur:  Signs of labor (contractions every 5-10 minutes for 1 hour)  Vaginal bleeding  Rupture of amniotic membranes (water breaks)  Fever  Decreased fetal movement (less than 5-10 movements in 1 hour)

## 2021-10-01 ENCOUNTER — Inpatient Hospital Stay: Admit: 2021-10-01 | Payer: MEDICAID

## 2021-10-01 LAB — SENTARA SPECIMEN COLLECTION

## 2021-10-03 ENCOUNTER — Inpatient Hospital Stay: Admit: 2021-10-03 | Payer: MEDICAID

## 2021-10-03 NOTE — Progress Notes (Signed)
PHYSICAL / OCCUPATIONAL THERAPY - DAILY TREATMENT NOTE (updated 2/23)    Patient Name: Nicole Rogers    Date: 10/03/2021    DOB: 07-04-1991  Insurance: Payor: [employer] MEDICAID / Plan: [employer] MEDICAID / Product Type: *No Product type* /      Patient DOB verified Yes     Visit #   Current / Total 4 16   Time   In / Out 830 909   Pain   In / Out 9 4   Subjective Functional Status/Changes: Pt reported that she was in and out of hospital this weekend due to HTN and high Blood glucose.  Pt is now taking insulin.   Changes to:  Meds, Allergies, Med Hx, Sx Hx?  If yes, update Summary List yes       TREATMENT AREA =  Other low back pain [M54.59]    OBJECTIVE    Therapeutic Procedures:  Tx Min Billable or 1:1 Min (if diff from Tx Min) Procedure, Rationale, Specifics   15 15 97110 Therapeutic Exercise (timed):  increase ROM, strength, coordination, balance, and proprioception to improve patient's ability to progress to PLOF and address remaining functional goals. (see flow sheet as applicable)     Details if applicable:       16 16 97112 Neuromuscular Re-Education (timed):  improve balance, coordination, kinesthetic sense, posture, core stability and proprioception to improve patient's ability to develop conscious control of individual muscles and awareness of position of extremities in order to progress to PLOF and address remaining functional goals. (see flow sheet as applicable)     Details if applicable:     8 8 [id number] Therapeutic Activity (timed):  use of dynamic activities replicating functional movements to increase ROM, strength, coordination, balance, and proprioception in order to improve patient's ability to progress to PLOF and address remaining functional goals.  (see flow sheet as applicable)     Details if applicable:     69 39 MC BC Totals Reminder: bill using total billable min of TIMED therapeutic procedures (example: do not include dry needle or estim unattended, both untimed codes, in totals to left)  8-22 min =  1 unit; 23-37 min = 2 units; 38-52 min = 3 units; 53-67 min = 4 units; 68-82 min = 5 units   Total Total     TOTAL TREATMENT TIME:        39     _0   Patient Education billed concurrently with other procedures   _1  Review HEP    _2  Progressed/Changed HEP, detail:    _3  Other detail:       Objective Information/Functional Measures/Assessment    Patient tolerated treatment session well today. Patient had no complaints with addition of TA on Swiss ball with LAQ and alt arm with LAQ to exercise program to accomplish improved core strength. Pt educated on pillow use for support for low back.  Patient continues to make steady progress toward goals and would benefit from continued skilled PT intervention to address remaining deficits outlined in goals below.    Patient will continue to benefit from skilled PT / OT services to modify and progress therapeutic interventions, analyze and address functional mobility deficits, analyze and address ROM deficits, analyze and address strength deficits, analyze and address soft tissue restrictions, analyze and cue for proper movement patterns, analyze and modify for postural abnormalities, analyze and address imbalance/dizziness, and instruct in home and community integration to address functional deficits and attain remaining goals.    Progress toward  goals / Updated goals:  _0   See Progress Note/Recertification    Short Term Goals: To be accomplished in 4 treatments:  Patient will be independent and compliant with HEP to progress toward goals and restore functional mobility.   Eval Status: issued at eval     Patient will improve FOTO score by 10 points to improve functional tolerance for exercise.   Eval Status: FOTO 19    Current: 28 GOAL MET      Patient will improve pain in low back to 5/10  to improve activity tolerance and restore prior level of function.  Eval Status: 10/10 at worst  Current: 10/10 when her back spasm at night 09/26/21     Pt will have 3/5 hip abduction  strength to return to goals of improved gait pattern and decreased back pain.  Eval Status:     Left(0-5) Right (0-5)   Hip Flexion (L1,2) 5 4+   Knee Extension (L3,4) 5 5   Ankle Dorsiflexion (L4) 5 5   Gluteus Maximus 3 3+   Hip Abduction 2 2+         Pt will have painfree lumbar AROM WFL to aid in functional mechanics for ambulation/ADLs.  Eval Status:   ROM % AROM Comments:pain, area   Forward flexion 40-60 28 cm to floor LBP   Extension 20-30 25% limited LBP   SideBend right 20-30 46 LBP    SideBend left 20-30 47     Rotation right 5-10 WFL LBP   Rotation left 5-10 WFL LBP         Long Term Goals: To be accomplished in 12 treatments:  Patient will improve FOTO score by 29 points to improve functional tolerance for standing and sitting tolerance.  Eval Status: FOTO 19  FOTO score = an established functional score where 100 = no disability  Current: 28 Progressing      2.   Pt will be able to sit for 2 hours with out increased pain in order to return to normal work duties.    Eval Status: increased pain with 30 min of sitting      3.   Pt will have 5/5 bil strength to return to goals of improved gait pattern and improved low back pain.  Eval Status:     Left(0-5) Right (0-5)   Hip Flexion (L1,2) 5 4+   Knee Extension (L3,4) 5 5   Ankle Dorsiflexion (L4) 5 5   Gluteus Maximus 3 3+   Hip Abduction 2 2+         4.   Patient will improve pain in low back to 1-2/10 at worst to improve standing and walking tolerance and restore prior level of function.  Eval Status: 10/10 at worst  Current: 10/10 when her back spasm at night 09/26/21     5.  Patient will be able to perform ASLR test with 1 grade improvement in order to improve core strength needed for sitting and standing.   Eval status: Active Straight Leg Raise Test  Difficulty rated 0-5  Right: 4  Left: 5     PLAN  Yes  Continue plan of care  _1   Upgrade activities as tolerated  _2   Discharge due to :  _3   Other:    Valetta Fuller, PT    10/03/2021    8:40 AM    Future  Appointments   Date Time Provider [place]   10/09/2021  8:00 AM Marna Keller, PT [facility]  10/10/2021  7:30 AM Marna Adsit, PT [facility]   10/16/2021  9:00 AM Carlos American Hollowell, PT [facility]   10/18/2021  6:00 PM Guy Sandifer, PT [facility]   10/23/2021  9:00 AM Guy Sandifer, PT [facility]   10/25/2021  6:00 PM Carlos American [place], PT [facility]

## 2021-10-08 ENCOUNTER — Inpatient Hospital Stay: Payer: MEDICAID

## 2021-10-08 LAB — POCT URINALYSIS DIPSTICK
Bilirubin, Urine, POC: NEGATIVE
Blood, UA POC: NEGATIVE
Glucose, UA POC: NEGATIVE mg/dL
Ketones, Urine, POC: NEGATIVE mg/dL
Leukocyte Est, UA POC: NEGATIVE
Nitrite, Urine, POC: NEGATIVE
Protein, Urine, POC: NEGATIVE mg/dL
Specific Gravity, Urine, POC: 1.01 (ref 1.001–1.023)
URINE UROBILINOGEN POC: 0.2 EU/dL (ref 0.2–1.0)
pH, Urine, POC: 7 (ref 5.0–9.0)

## 2021-10-08 LAB — CBC WITH AUTO DIFFERENTIAL
Absolute Immature Granulocyte: 0 10*3/uL (ref 0.00–0.04)
Basophils %: 0 % (ref 0–2)
Basophils Absolute: 0 10*3/uL (ref 0.0–0.1)
Eosinophils %: 0 % (ref 0–5)
Eosinophils Absolute: 0 10*3/uL (ref 0.0–0.4)
Hematocrit: 30.5 % — ABNORMAL LOW (ref 35.0–45.0)
Hemoglobin: 9.9 g/dL — ABNORMAL LOW (ref 12.0–16.0)
Immature Granulocytes: 0 % (ref 0.0–0.5)
Lymphocytes %: 32 % (ref 21–52)
Lymphocytes Absolute: 1.9 10*3/uL (ref 0.9–3.6)
MCH: 28.4 PG (ref 24.0–34.0)
MCHC: 32.5 g/dL (ref 31.0–37.0)
MCV: 87.4 FL (ref 78.0–100.0)
MPV: 12.2 FL — ABNORMAL HIGH (ref 9.2–11.8)
Monocytes %: 7 % (ref 3–10)
Monocytes Absolute: 0.4 10*3/uL (ref 0.05–1.2)
Neutrophils %: 61 % (ref 40–73)
Neutrophils Absolute: 3.6 10*3/uL (ref 1.8–8.0)
Nucleated RBCs: 0 PER 100 WBC
Platelets: 175 10*3/uL (ref 135–420)
RBC: 3.49 M/uL — ABNORMAL LOW (ref 4.20–5.30)
RDW: 14.4 % (ref 11.6–14.5)
WBC: 6 10*3/uL (ref 4.6–13.2)
nRBC: 0 10*3/uL (ref 0.00–0.01)

## 2021-10-08 LAB — URINALYSIS
Bilirubin Urine: NEGATIVE
Blood, Urine: NEGATIVE
Glucose, UA: NEGATIVE mg/dL
Ketones, Urine: NEGATIVE mg/dL
Leukocyte Esterase, Urine: NEGATIVE
Nitrite, Urine: NEGATIVE
Protein, UA: NEGATIVE mg/dL
Specific Gravity, UA: <1.005 — ABNORMAL LOW (ref 1.005–1.030)
Urobilinogen, Urine: 0.2 EU/dL (ref 0.2–1.0)
pH, Urine: 7 (ref 5.0–8.0)

## 2021-10-08 LAB — PROTEIN / CREATININE RATIO, URINE
Creatinine, Ur: 13 mg/dL — ABNORMAL LOW (ref 30–125)
Protein, Urine, Random: <5 mg/dL (ref <11.9)

## 2021-10-08 MED ORDER — LACTATED RINGERS IV SOLN
INTRAVENOUS | Status: DC
Start: 2021-10-08 — End: 2021-10-08

## 2021-10-08 MED ORDER — ACETAMINOPHEN 325 MG PO TABS
325 MG | ORAL | Status: DC | PRN
Start: 2021-10-08 — End: 2021-10-08

## 2021-10-08 MED ORDER — ONDANSETRON HCL 4 MG/2ML IJ SOLN
4 MG/2ML | Freq: Four times a day (QID) | INTRAMUSCULAR | Status: DC | PRN
Start: 2021-10-08 — End: 2021-10-08

## 2021-10-08 MED ORDER — LACTATED RINGERS IV BOLUS
Freq: Once | INTRAVENOUS | Status: DC
Start: 2021-10-08 — End: 2021-10-08

## 2021-10-08 MED ORDER — ONDANSETRON 4 MG PO TBDP
4 MG | Freq: Three times a day (TID) | ORAL | Status: DC | PRN
Start: 2021-10-08 — End: 2021-10-08

## 2021-10-08 MED FILL — LACTATED RINGERS IV SOLN: INTRAVENOUS | Qty: 1000

## 2021-10-08 NOTE — Progress Notes (Addendum)
1920: Bedside and Verbal shift change report given to H. Dayton Scrape, RN (Cabin crew) by Newton Pigg, RN (offgoing nurse). Report included the following information Adult Overview, Intake/Output, MAR, Recent Results, and Med Rec Status. Resting comfortably with no requests or concerns at this time.     2010: PCR result returned, reported to M. Cheuvront, CNM. Ok to d/c to home with instructions to return to office for regular prenatal visits. Nicole Rogers c/o lower leg cramping at night, discussed increase hydration and magnesium supplement. Discharge instructions printed, reviewed with pt.

## 2021-10-08 NOTE — Progress Notes (Signed)
1744 -Patient arrives to LDT. G3P2 at 75.[redacted]weeks gestation w/ c/o taking BP at home and reports it was high at home. Pt reports positive fetal movement, denies CTXs, denies LOF/VB. Pt reports headache rating 5/10, reports frequent headaches throughout pregnancy. Denies wanting anything for pain. Patient instructed to provide urine sample and change into gown at this time.     1756- Pt reports complications of anemia, GDM (reports taking 10u of insulin at night), and HTN w/ no HTN meds during this pregnancy . Abdomen soft upon palpation. EFM applied. FHR noted 140s .     1810- M. Cheuvront, CNM notified of pt arrival and status. Verbal to obtain [facility] labs.     1920- Report given to H. Dayton Scrape, RN.

## 2021-10-09 ENCOUNTER — Inpatient Hospital Stay: Admit: 2021-10-09 | Payer: MEDICAID

## 2021-10-09 LAB — COMPREHENSIVE METABOLIC PANEL
ALT: 15 U/L (ref 13–56)
AST: 12 U/L (ref 10–38)
Albumin/Globulin Ratio: 0.7 — ABNORMAL LOW (ref 0.8–1.7)
Albumin: 2.7 g/dL — ABNORMAL LOW (ref 3.4–5.0)
Alk Phosphatase: 106 U/L (ref 45–117)
Anion Gap: 6 mmol/L (ref 3.0–18)
BUN: 6 MG/DL — ABNORMAL LOW (ref 7.0–18)
Bun/Cre Ratio: 12 (ref 12–20)
CO2: 23 mmol/L (ref 21–32)
Calcium: 8.7 MG/DL (ref 8.5–10.1)
Chloride: 109 mmol/L (ref 100–111)
Creatinine: 0.51 MG/DL — ABNORMAL LOW (ref 0.6–1.3)
Est, Glom Filt Rate: >60 mL/min/{1.73_m2} (ref >60)
Globulin: 4.1 g/dL — ABNORMAL HIGH (ref 2.0–4.0)
Glucose: 95 mg/dL (ref 74–99)
Potassium: 3.7 mmol/L (ref 3.5–5.5)
Sodium: 138 mmol/L (ref 136–145)
Total Bilirubin: 0.2 MG/DL (ref 0.2–1.0)
Total Protein: 6.8 g/dL (ref 6.4–8.2)

## 2021-10-09 LAB — URIC ACID: Uric Acid: 4.5 MG/DL (ref 2.6–7.2)

## 2021-10-09 NOTE — Progress Notes (Signed)
[facility] at [address] [address]  Ph [phone]  Fx [phone]    Physical Therapy Discharge Summary    Patient name: Nicole Rogers Start of Care: 09/13/2021   Referral source: Loraine Leriche, NP-C DOB: [birth year]               Medical Diagnosis: Other low back pain [M54.59]    Onset Date:            November 2022   Treatment Diagnosis: low back pain    Prior Hospitalization: see medical history Provider#: [id number]   Medications: Verified on Patient summary List   Comorbidities: gestational diabetes, hx preclampsia with first son, HTN, vaginal births delivered at 6 weeks, second child had increased fluid around him   Prior Level of Function: functionally independent, no AD, active lifestyle mom of 2 under 2    Visits from Start of Care: 5    Missed Visits: 5    Reporting Period : 09/13/21 to 11/13/21    Goals/Measure of Progress:  Short Term Goals: To be accomplished in 4 treatments:  Patient will be independent and compliant with HEP to progress toward goals and restore functional mobility.   Eval Status: issued at eval  Current: reports daily compliance with HEP - encouraged pt to obtain referral to get started with PT as soon as she moves 10/09/21     Patient will improve FOTO score by 10 points to improve functional tolerance for exercise.   Eval Status: FOTO 19                          Current: 28 GOAL MET      Patient will improve pain in low back to 5/10  to improve activity tolerance and restore prior level of function.  Eval Status: 10/10 at worst  Current: 10/10 when her back spasm at night 09/26/21     Pt will have 3/5 hip abduction strength to return to goals of improved gait pattern and decreased back pain.  Eval Status:     Left(0-5) Right (0-5)   Hip Flexion (L1,2) 5 4+   Knee Extension (L3,4) 5 5   Ankle Dorsiflexion (L4) 5 5   Gluteus Maximus 3 3+   Hip Abduction 2 2+         Pt will have painfree lumbar AROM WFL to aid in functional mechanics for  ambulation/ADLs.  Eval Status:   ROM % AROM Comments:pain, area   Forward flexion 40-60 28 cm to floor LBP   Extension 20-30 25% limited LBP   SideBend right 20-30 46 LBP    SideBend left 20-30 47     Rotation right 5-10 WFL LBP   Rotation left 5-10 WFL LBP         Long Term Goals: To be accomplished in 12 treatments:  Patient will improve FOTO score by 29 points to improve functional tolerance for standing and sitting tolerance.  Eval Status: FOTO 19  FOTO score = an established functional score where 100 = no disability  Current: 28 Progressing      2.   Pt will be able to sit for 2 hours with out increased pain in order to return to normal work duties.    Eval Status: increased pain with 30 min of sitting      3.   Pt will have 5/5 bil strength to return to goals of improved  gait pattern and improved low back pain.  Eval Status:     Left(0-5) Right (0-5)   Hip Flexion (L1,2) 5 4+   Knee Extension (L3,4) 5 5   Ankle Dorsiflexion (L4) 5 5   Gluteus Maximus 3 3+   Hip Abduction 2 2+         4.   Patient will improve pain in low back to 1-2/10 at worst to improve standing and walking tolerance and restore prior level of function.  Eval Status: 10/10 at worst  Current: 10/10 when her back spasm at night 09/26/21     5.  Patient will be able to perform ASLR test with 1 grade improvement in order to improve core strength needed for sitting and standing.   Eval status: Active Straight Leg Raise Test  Difficulty rated 0-5  Right: 4  Left: 5    Assessment/ Summary of Care:  Unable to formally assess goals as pt failed to show for scheduled followup appts. Please see above for goals assessment while pt was current under the care of this clinic. Please DC to HEP at this time. Thank you for this referral. Pt will require new order if he/she requires further rehab services.      RECOMMENDATIONS:  '[x]' Discontinue therapy: '[]' Patient has reached or is progressing toward set goals      '[x]' Patient is non-compliant or has  abdicated      '[]' Due to lack of appreciable progress towards set goals    Valetta Fuller, PT 11/13/2021 1:08 PM    ------------------------------------------------------------------------------------------------------------------------------  NOTE TO PHYSICIAN:  Please complete the following and fax to:   [facility] at [phone]  If you are unable to process this request in   24 hours, please contact our office.     '[]'  I have read the above report and request that my patient continue therapy with the following changes/special instructions:  '[]'  I have read the above report and request that my patient be discharged from therapy    Physician's Signature:____________________ Date:_________ TIME:________                                      Loraine Leriche, FNP      ** Signature, Date and Time must be completed for valid certification **

## 2021-10-09 NOTE — Progress Notes (Signed)
PHYSICAL / OCCUPATIONAL THERAPY - DAILY TREATMENT NOTE (updated 2/23)    Patient Name: Nicole Rogers    Date: 10/09/2021    DOB: [birth year]  Insurance: Payor: [employer] / Plan: [employer] / Product Type: *No Product type* /      Patient DOB verified Yes     Visit #   Current / Total 5 16   Time   In / Out 0802 0830   Pain   In / Out 6 3   Subjective Functional Status/Changes: Pt pleasant, reports no new changes. Pt reports she is moving to [place] next week.   Changes to:  Meds, Allergies, Med Hx, Sx Hx?  If yes, update Summary List no       TREATMENT AREA =  Other low back pain [M54.59]    OBJECTIVE    Therapeutic Procedures:  Tx Min Billable or 1:1 Min (if diff from Tx Min) Procedure, Rationale, Specifics   10 10 97110 Therapeutic Exercise (timed):  increase ROM, strength, coordination, balance, and proprioception to improve patient's ability to progress to PLOF and address remaining functional goals. (see flow sheet as applicable)     Details if applicable:       10 10 97112 Neuromuscular Re-Education (timed):  improve balance, coordination, kinesthetic sense, posture, core stability and proprioception to improve patient's ability to develop conscious control of individual muscles and awareness of position of extremities in order to progress to PLOF and address remaining functional goals. (see flow sheet as applicable)     Details if applicable:     8 8 16109 Therapeutic Activity (timed):  use of dynamic activities replicating functional movements to increase ROM, strength, coordination, balance, and proprioception in order to improve patient's ability to progress to PLOF and address remaining functional goals.  (see flow sheet as applicable)     Details if applicable:            Details if applicable:            Details if applicable:     28 29 MC BC Totals Reminder: bill using total billable min of TIMED therapeutic procedures (example: do not include dry needle or estim unattended, both untimed  codes, in totals to left)  8-22 min = 1 unit; 23-37 min = 2 units; 38-52 min = 3 units; 53-67 min = 4 units; 68-82 min = 5 units   Total Total     TOTAL TREATMENT TIME:        28     '[x]'   Patient Education billed concurrently with other procedures   '[x]'  Review HEP    '[]'  Progressed/Changed HEP, detail:    '[]'  Other detail:       Objective Information/Functional Measures/Assessment    Patient tolerated treatment session well today. Patient had no complaints with addition of SB PPT with heel slides to exercise program to accomplish improved strength.   Patient continues to make good progress toward goals and would benefit from continued skilled PT intervention to address remaining deficits outlined in goals below.     Patient will continue to benefit from skilled PT / OT services to modify and progress therapeutic interventions, analyze and address functional mobility deficits, analyze and address ROM deficits, analyze and address strength deficits, analyze and address soft tissue restrictions, analyze and cue for proper movement patterns, and analyze and modify for postural abnormalities to address functional deficits and attain remaining goals.    Progress toward goals / Updated goals:  '[]'   See Progress Note/Recertification  Short Term Goals: To be accomplished in 4 treatments:  Patient will be independent and compliant with HEP to progress toward goals and restore functional mobility.   Eval Status: issued at eval  Current: reports daily compliance with HEP - encouraged pt to obtain referral to get started with PT as soon as she moves 10/09/21     Patient will improve FOTO score by 10 points to improve functional tolerance for exercise.   Eval Status: FOTO 19                          Current: 28 GOAL MET      Patient will improve pain in low back to 5/10  to improve activity tolerance and restore prior level of function.  Eval Status: 10/10 at worst  Current: 10/10 when her back spasm at night 09/26/21     Pt will have  3/5 hip abduction strength to return to goals of improved gait pattern and decreased back pain.  Eval Status:     Left(0-5) Right (0-5)   Hip Flexion (L1,2) 5 4+   Knee Extension (L3,4) 5 5   Ankle Dorsiflexion (L4) 5 5   Gluteus Maximus 3 3+   Hip Abduction 2 2+         Pt will have painfree lumbar AROM WFL to aid in functional mechanics for ambulation/ADLs.  Eval Status:   ROM % AROM Comments:pain, area   Forward flexion 40-60 28 cm to floor LBP   Extension 20-30 25% limited LBP   SideBend right 20-30 46 LBP    SideBend left 20-30 47     Rotation right 5-10 WFL LBP   Rotation left 5-10 WFL LBP         Long Term Goals: To be accomplished in 12 treatments:  Patient will improve FOTO score by 29 points to improve functional tolerance for standing and sitting tolerance.  Eval Status: FOTO 19  FOTO score = an established functional score where 100 = no disability  Current: 28 Progressing      2.   Pt will be able to sit for 2 hours with out increased pain in order to return to normal work duties.    Eval Status: increased pain with 30 min of sitting      3.   Pt will have 5/5 bil strength to return to goals of improved gait pattern and improved low back pain.  Eval Status:     Left(0-5) Right (0-5)   Hip Flexion (L1,2) 5 4+   Knee Extension (L3,4) 5 5   Ankle Dorsiflexion (L4) 5 5   Gluteus Maximus 3 3+   Hip Abduction 2 2+         4.   Patient will improve pain in low back to 1-2/10 at worst to improve standing and walking tolerance and restore prior level of function.  Eval Status: 10/10 at worst  Current: 10/10 when her back spasm at night 09/26/21     5.  Patient will be able to perform ASLR test with 1 grade improvement in order to improve core strength needed for sitting and standing.   Eval status: Active Straight Leg Raise Test  Difficulty rated 0-5  Right: 4  Left: 5         PLAN  Yes  Continue plan of care  '[]'   Upgrade activities as tolerated  '[]'   Discharge due to :  '[]'   OtherLucia Gaskins, PT    10/09/2021     12:59 PM    Future Appointments   Date Time Provider [place]   10/16/2021  9:00 AM [place], PT [facility]

## 2024-08-05 ENCOUNTER — Ambulatory Visit: Admitting: Family Medicine
